# Patient Record
Sex: Female | Born: 1945 | Race: White | Hispanic: No | Marital: Single | Smoking: Former smoker
Health system: Southern US, Community
[De-identification: ages and names within clinical notes are randomized; demographics above are authoritative.]

## PROBLEM LIST (undated history)

## (undated) DIAGNOSIS — S63414A Traumatic rupture of collateral ligament of right ring finger at metacarpophalangeal and interphalangeal joint, initial encounter: Secondary | ICD-10-CM

## (undated) DIAGNOSIS — E860 Dehydration: Secondary | ICD-10-CM

## (undated) DIAGNOSIS — R748 Abnormal levels of other serum enzymes: Secondary | ICD-10-CM

## (undated) DIAGNOSIS — R519 Headache, unspecified: Secondary | ICD-10-CM

## (undated) DIAGNOSIS — E861 Hypovolemia: Secondary | ICD-10-CM

## (undated) DIAGNOSIS — H6993 Unspecified Eustachian tube disorder, bilateral: Secondary | ICD-10-CM

## (undated) DIAGNOSIS — U071 COVID-19: Secondary | ICD-10-CM

## (undated) DIAGNOSIS — H6690 Otitis media, unspecified, unspecified ear: Secondary | ICD-10-CM

## (undated) DIAGNOSIS — C50919 Malignant neoplasm of unspecified site of unspecified female breast: Secondary | ICD-10-CM

## (undated) DIAGNOSIS — M5412 Radiculopathy, cervical region: Secondary | ICD-10-CM

## (undated) DIAGNOSIS — M8580 Other specified disorders of bone density and structure, unspecified site: Secondary | ICD-10-CM

## (undated) DIAGNOSIS — R5383 Other fatigue: Secondary | ICD-10-CM

## (undated) DIAGNOSIS — U099 Post covid-19 condition, unspecified: Secondary | ICD-10-CM

## (undated) DIAGNOSIS — J0120 Acute ethmoidal sinusitis, unspecified: Secondary | ICD-10-CM

## (undated) DIAGNOSIS — L570 Actinic keratosis: Secondary | ICD-10-CM

## (undated) DIAGNOSIS — S161XXA Strain of muscle, fascia and tendon at neck level, initial encounter: Secondary | ICD-10-CM

## (undated) DIAGNOSIS — R051 Acute cough: Secondary | ICD-10-CM

## (undated) DIAGNOSIS — E559 Vitamin D deficiency, unspecified: Secondary | ICD-10-CM

## (undated) DIAGNOSIS — R55 Syncope and collapse: Secondary | ICD-10-CM

## (undated) DIAGNOSIS — R42 Dizziness and giddiness: Secondary | ICD-10-CM

## (undated) DIAGNOSIS — G909 Disorder of the autonomic nervous system, unspecified: Secondary | ICD-10-CM

## (undated) HISTORY — DX: Hypovolemia: E86.1

## (undated) HISTORY — DX: Headache, unspecified: R51.9

## (undated) HISTORY — DX: Radiculopathy, cervical region: M54.12

## (undated) HISTORY — DX: Otitis media, unspecified, unspecified ear: H66.90

## (undated) HISTORY — PX: BREAST LUMPECTOMY: SHX2

## (undated) HISTORY — DX: Strain of muscle, fascia and tendon at neck level, initial encounter: S16.1XXA

## (undated) HISTORY — DX: Actinic keratosis: L57.0

## (undated) HISTORY — PX: BREAST LUMPECTOMY WITH AXILLARY LYMPH NODE BIOPSY: SHX5593

## (undated) HISTORY — DX: COVID-19: U07.1

## (undated) HISTORY — DX: Abnormal levels of other serum enzymes: R74.8

## (undated) HISTORY — DX: Unspecified eustachian tube disorder, bilateral: H69.93

## (undated) HISTORY — DX: Disorder of the autonomic nervous system, unspecified: G90.9

## (undated) HISTORY — DX: Post covid-19 condition, unspecified: U09.9

## (undated) HISTORY — DX: Other fatigue: R53.83

## (undated) HISTORY — DX: Malignant neoplasm of unspecified site of unspecified female breast: C50.919

## (undated) HISTORY — DX: Other specified disorders of bone density and structure, unspecified site: M85.80

## (undated) HISTORY — DX: Acute ethmoidal sinusitis, unspecified: J01.20

## (undated) HISTORY — DX: Acute cough: R05.1

## (undated) HISTORY — DX: Syncope and collapse: R55

## (undated) HISTORY — DX: Dizziness and giddiness: R42

## (undated) HISTORY — DX: Dehydration: E86.0

## (undated) HISTORY — DX: Vitamin D deficiency, unspecified: E55.9

## (undated) HISTORY — DX: Traumatic rupture of collateral ligament of right ring finger at metacarpophalangeal and interphalangeal joint, initial encounter: S63.414A

## (undated) NOTE — *Deleted (*Deleted)
[facility]   [phone] [phone]   Clinic Follow up Note   Patient Care Team: Lewis Moccasin, MD as PCP - General (Family Medicine) Seward Meth, Dennison Nancy, MD as Referring Physician (Hematology and Oncology) Darryl Lent, MD as Referring Physician (Surgical Oncology) Wynetta Emery, MD as Referring Physician (Radiation Oncology) Loa Socks, NP as Nurse Practitioner (Hematology and Oncology)  Date of Service:  07/17/2020  CHIEF COMPLAINT: Follow up right breast cancer  SUMMARY OF ONCOLOGIC HISTORY: Oncology History Overview Note  Cancer Staging Breast cancer of upper-outer quadrant of right female breast [facility]) Staging form: Breast, AJCC 7th Edition - Pathologic stage from 02/13/2017: Stage IA (T1c(m), N0, cM0) - Signed by Malachy Mood, MD on 10/27/2016     Breast cancer of upper-outer quadrant of right female breast (HCC)  12/12/2015 Mammogram   Screening Mammogram 12/12/15 UOQ right breast density.   12/25/2015 Mammogram   Bilateral diagnostic mammogram and [place] 12/25/15 1) Irregular mass with partially obscured spiculated margins measuring 1.7 cm in the UOQ right breast. 2) USshowed a round hypoechoic mass with indistinct margins in the right breast the 10:00 position 3 cm the nipple which measured 1.1 x 0.9 x 1.1 cm. Additionally, there was a second adjacent mass in the right breast at the 10:00 position 3 cm the nipple which measured 0.5 x 0.6 cm. US of the right axilla was negative.   01/03/2016 Initial Biopsy   Right breast biopsy 01/03/16 Biopsy of the right smaller mass revealed LCIS with no invasive carcinoma identified. Biopsy of the larger right breast mass revealed grade 2 invasive lobular carcinoma.   01/03/2016 Receptors her2   ER 71-80% +, PR 91-100% +, HER2 -   01/23/2016 Breast MRI   Bilateral breast MRI 01/23/16 Adjacent enhancing masses in the right breast at 10:00, consistent with  biopsy-proven ILC and LCIS and  consistent with prior mammograms and ultrasound.The area including the two masses measured approximately 1.6 x 1.3 x 0.9 cm (although the extent of disease on ultrasound measured 3.0 cm).No evidence of additional sites of disease.   01/24/2016 Initial Diagnosis   Breast cancer of upper-outer quadrant of right female breast (HCC)   02/14/2016 Surgery   Right lumpectomy and SLN biopsy 02/14/16 Lumpectomy revealed grade 2 invasive lobular carcinoma with two foci, measuring 1.4 cm and 0.6 cm in a background of lobular carcinoma in situ, negative margins. A biopsied right axillary lymph node was negative. mpT1c, pN0   02/14/2016 Oncotype testing   Oncotype DX score was 16; indicting a recurrence risk of 10% in 5 years with Tamoxifen.   03/24/2016 Imaging   Bone density study 03/24/16  Osteopenia with a T-Score of -2.3 in the AP spine and -0.5 in the femoral neck.    2017 -  Radiation Therapy   The patient was offered radiation therapy, but declined due to her concerns of side effects.    09/11/2016 - 10/08/2016 Anti-estrogen oral therapy   Dr. Merlene Pulling prescribed Tamoxifen 20 mg once a day on 09/11/16. The patient stopped on 10/08/16 due to mood swings (depression), night sweats, difficulty sleeping, broken nails, headaches, right arm pain, light-headedness, chills, fever, fatigue, abdominal bloating, and abdominal pain. The patient reports her symptoms have since resolved, except for fatigue.    02/16/2018 Mammogram   02/16/2018 Mammogram FINDINGS: The breasts are heterogeneously dense, which may obscure small masses.  At the surgical site in the right breast, there is density and  architectural distortion, stable compared to the  prior films and  consistent with a post surgical scar. The lumpectomy site shows no  mammographic evidence of recurrent malignancy. There are no suspicious  masses, calcifications, or other findings in either breast.       CURRENT THERAPY:  Surveillance   INTERVAL  HISTORY: *** Regina Vincent is here for a follow up of right breast cancer. She was last seen by me 1 year ago. She presents to the clinic alone.    REVIEW OF SYSTEMS:  *** Constitutional: Denies fevers, chills or abnormal weight loss Eyes: Denies blurriness of vision Ears, nose, mouth, throat, and face: Denies mucositis or sore throat Respiratory: Denies cough, dyspnea or wheezes Cardiovascular: Denies palpitation, chest discomfort or lower extremity swelling Gastrointestinal:  Denies nausea, heartburn or change in bowel habits Skin: Denies abnormal skin rashes Lymphatics: Denies new lymphadenopathy or easy bruising Neurological:Denies numbness, tingling or new weaknesses Behavioral/Psych: Mood is stable, no new changes  All other systems were reviewed with the patient and are negative.  MEDICAL HISTORY:  Past Medical History:  Diagnosis Date  . Breast cancer [facility])     SURGICAL HISTORY: Past Surgical History:  Procedure Laterality Date  . BREAST LUMPECTOMY    . BREAST LUMPECTOMY WITH AXILLARY LYMPH NODE BIOPSY Right   . cataract surgery   2015    I have reviewed the social history and family history with the patient and they are unchanged from previous note.  ALLERGIES:  is allergic to naproxen, shellfish-derived products, and ibuprofen.  MEDICATIONS:  Current Outpatient Medications  Medication Sig Dispense Refill  . aspirin 81 MG chewable tablet Chew by mouth.    . TURMERIC CURCUMIN PO Take 400 mg by mouth 2 (two) times daily.     No current facility-administered medications for this visit.    PHYSICAL EXAMINATION: ECOG PERFORMANCE STATUS: {CHL ONC ECOG PS:[id number]}  There were no vitals filed for this visit. There were no vitals filed for this visit. *** GENERAL:alert, no distress and comfortable SKIN: skin color, texture, turgor are normal, no rashes or significant lesions EYES: normal, Conjunctiva are pink and non-injected, sclera clear {OROPHARYNX:no  exudate, no erythema and lips, buccal mucosa, and tongue normal}  NECK: supple, thyroid normal size, non-tender, without nodularity LYMPH:  no palpable lymphadenopathy in the cervical, axillary {or inguinal} LUNGS: clear to auscultation and percussion with normal breathing effort HEART: regular rate & rhythm and no murmurs and no lower extremity edema ABDOMEN:abdomen soft, non-tender and normal bowel sounds Musculoskeletal:no cyanosis of digits and no clubbing  NEURO: alert & oriented x 3 with fluent speech, no focal motor/sensory deficits  LABORATORY DATA:  I have reviewed the data as listed CBC Latest Ref Rng & Units 06/22/2019 06/13/2018 05/24/2017  WBC 4.0 - 10.5 K/uL 7.6 5.7 8.3  Hemoglobin 12.0 - 15.0 g/dL 16.1 09.6 04.5  Hematocrit 36 - 46 % 40.3 41.4 42.0  Platelets 150 - 400 K/uL 155 193 210     CMP Latest Ref Rng & Units 06/22/2019 06/13/2018 05/24/2017  Glucose 70 - 99 mg/dL 75 409(W) 119  BUN 8 - 23 mg/dL 21 16 14.7  Creatinine 0.44 - 1.00 mg/dL 8.29 5.62 0.9  Sodium 130 - 145 mmol/L 139 136 138  Potassium 3.5 - 5.1 mmol/L 3.8 4.1 4.6  Chloride 98 - 111 mmol/L 102 101 -  CO2 22 - 32 mmol/L 28 28 29   Calcium 8.9 - 10.3 mg/dL 9.5 9.3 86.5  Total Protein 6.5 - 8.1 g/dL 6.7 6.9 7.4  Total Bilirubin 0.3 - 1.2  mg/dL 0.9 1.0 8.29  Alkaline Phos 38 - 126 U/L 68 59 62  AST 15 - 41 U/L 30 33 54(H)  ALT 0 - 44 U/L 40 32 39      RADIOGRAPHIC STUDIES: I have personally reviewed the radiological images as listed and agreed with the findings in the report. No results found.   ASSESSMENT & PLAN:  Regina Vincent is a 35 y.o. female with    1. 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 She is s/p right lumpectomy and SLNB. The Oncotype DX score was 16. This is low risk disease, and does not need adjuvant chemo. The patient was offered  radiation therapy, but declined due to her concerns of side effects. She attempted Tamoxifen but stopped after 1 month due to poor tolerance.  -She is clinically doing well. Lab reviewed, her CBC and CMP are within normal limits. Her physical exam normal. Her 02/2019 mammogram at [facility] was unremarkable. There is no clinical concern for recurrence. -Continue surveillance. Next mammogram in 02/2020. I discussed the option of additional screening with breast MRI, she declined.  -F/u in 1 year. F/u with her PCP in interim.   2. Osteopenia -03/24/16 DEXA revealed osteopenia with a T-Score of -2.3 in the AP spine and -0.5 in the femoral neck. -She is on Vitamin D for her VitD deficiency. She previously declined oral calcium.  -I encouraged her to remain very active with walking, running and weight bearing exercise.    PLAN -Lab and f/u in 1 year -Mammogram in 02/2020   No problem-specific Assessment & Plan notes found for this encounter.   No orders of the defined types were placed in this encounter.  All questions were answered. The patient knows to call the clinic with any problems, questions or concerns. No barriers to learning was detected. The total time spent in the appointment was {CHL ONC TIME VISIT - FAOZH:[id number]}.     Delphina Cahill 07/17/2020   Rogelia Rohrer, am acting as scribe for Malachy Mood, MD.   {Add scribe attestation statement}

---

## 2013-08-17 HISTORY — PX: OTHER SURGICAL HISTORY: SHX169

## 2016-01-24 DIAGNOSIS — C50411 Malignant neoplasm of upper-outer quadrant of right female breast: Secondary | ICD-10-CM | POA: Insufficient documentation

## 2016-06-15 ENCOUNTER — Inpatient Hospital Stay: Payer: Self-pay | Admitting: Hematology and Oncology

## 2016-06-22 ENCOUNTER — Inpatient Hospital Stay: Payer: Self-pay | Admitting: Hematology and Oncology

## 2016-08-25 ENCOUNTER — Inpatient Hospital Stay: Payer: Federal, State, Local not specified - PPO

## 2016-08-25 ENCOUNTER — Encounter (INDEPENDENT_AMBULATORY_CARE_PROVIDER_SITE_OTHER): Payer: Self-pay

## 2016-08-25 ENCOUNTER — Inpatient Hospital Stay
Admission: RE | Admit: 2016-08-25 | Discharge: 2016-08-25 | Disposition: A | Discharge status: Home / Self Care | Payer: Self-pay | Source: Ambulatory Visit | Attending: Hematology and Oncology | Admitting: Hematology and Oncology

## 2016-08-25 ENCOUNTER — Inpatient Hospital Stay
Payer: Federal, State, Local not specified - PPO | Attending: Hematology and Oncology | Admitting: Hematology and Oncology

## 2016-08-25 ENCOUNTER — Other Ambulatory Visit: Payer: Self-pay | Admitting: Hematology and Oncology

## 2016-08-25 ENCOUNTER — Encounter: Payer: Self-pay | Admitting: *Deleted

## 2016-08-25 ENCOUNTER — Encounter: Payer: Self-pay | Admitting: Hematology and Oncology

## 2016-08-25 ENCOUNTER — Ambulatory Visit
Admission: RE | Admit: 2016-08-25 | Discharge: 2016-08-25 | Disposition: A | Discharge status: Home / Self Care | Payer: Self-pay | Source: Ambulatory Visit | Attending: Hematology and Oncology | Admitting: Hematology and Oncology

## 2016-08-25 VITALS — BP 132/83 | HR 63 | Temp 97.5°F | Ht 62.0 in | Wt 110.0 lb

## 2016-08-25 DIAGNOSIS — Z170 Estrogen receptor positive status [ER+]: Principal | ICD-10-CM

## 2016-08-25 DIAGNOSIS — Z9011 Acquired absence of right breast and nipple: Secondary | ICD-10-CM

## 2016-08-25 DIAGNOSIS — C50411 Malignant neoplasm of upper-outer quadrant of right female breast: Secondary | ICD-10-CM

## 2016-08-25 DIAGNOSIS — M8588 Other specified disorders of bone density and structure, other site: Secondary | ICD-10-CM

## 2016-08-25 DIAGNOSIS — Z7982 Long term (current) use of aspirin: Secondary | ICD-10-CM

## 2016-08-25 DIAGNOSIS — M25611 Stiffness of right shoulder, not elsewhere classified: Secondary | ICD-10-CM | POA: Diagnosis not present

## 2016-08-25 DIAGNOSIS — M8580 Other specified disorders of bone density and structure, unspecified site: Secondary | ICD-10-CM | POA: Insufficient documentation

## 2016-08-25 DIAGNOSIS — Z87891 Personal history of nicotine dependence: Secondary | ICD-10-CM | POA: Diagnosis not present

## 2016-08-25 LAB — CBC WITH DIFFERENTIAL/PLATELET
Basophils Absolute: 0.1 10*3/uL (ref 0–0.1)
Basophils Relative: 1 %
Eosinophils Absolute: 0 10*3/uL (ref 0–0.7)
Eosinophils Relative: 0 %
HCT: 42.7 % (ref 35.0–47.0)
Hemoglobin: 14.7 g/dL (ref 12.0–16.0)
Lymphocytes Relative: 16 %
Lymphs Abs: 1.3 10*3/uL (ref 1.0–3.6)
MCH: 33.2 pg (ref 26.0–34.0)
MCHC: 34.5 g/dL (ref 32.0–36.0)
MCV: 96.1 fL (ref 80.0–100.0)
Monocytes Absolute: 0.4 10*3/uL (ref 0.2–0.9)
Monocytes Relative: 5 %
Neutro Abs: 6 10*3/uL (ref 1.4–6.5)
Neutrophils Relative %: 78 %
Platelets: 183 10*3/uL (ref 150–440)
RBC: 4.44 MIL/uL (ref 3.80–5.20)
RDW: 12.7 % (ref 11.5–14.5)
WBC: 7.7 10*3/uL (ref 3.6–11.0)

## 2016-08-25 LAB — COMPREHENSIVE METABOLIC PANEL
ALT: 21 U/L (ref 14–54)
AST: 31 U/L (ref 15–41)
Albumin: 4.5 g/dL (ref 3.5–5.0)
Alkaline Phosphatase: 60 U/L (ref 38–126)
Anion gap: 8 (ref 5–15)
BUN: 19 mg/dL (ref 6–20)
CO2: 28 mmol/L (ref 22–32)
Calcium: 9.9 mg/dL (ref 8.9–10.3)
Chloride: 98 mmol/L — ABNORMAL LOW (ref 101–111)
Creatinine, Ser: 0.7 mg/dL (ref 0.44–1.00)
GFR calc Af Amer: >60 mL/min (ref >60)
GFR calc non Af Amer: >60 mL/min (ref >60)
Glucose, Bld: 146 mg/dL — ABNORMAL HIGH (ref 65–99)
Potassium: 4.1 mmol/L (ref 3.5–5.1)
Sodium: 134 mmol/L — ABNORMAL LOW (ref 135–145)
Total Bilirubin: 0.7 mg/dL (ref 0.3–1.2)
Total Protein: 7.3 g/dL (ref 6.5–8.1)

## 2016-08-25 NOTE — Progress Notes (Signed)
  Oncology Nurse Navigator Documentation  Navigator Location: [facility]-Med Onc (08/25/16 1000)   )Navigator Encounter Type: Initial MedOnc (08/25/16 1000)    Met patient today during her initial visit with Dr. Mike Gip.  Patient was diagnosed in June 2017, and had her surgery at [facility].  She had refused radiation therapy and has not initiated any antihormonal therapy as of today.  Gave patient resource information, and contact info.  She is interested in support group and other resources that may be offered in [place].  Gave patient Arrie Aran Stuart's name to contact for more info about support group in [place].  She is to call with any questions or needs.  Will give Dawn a heads up that the patient may contact her.      Interventions: Referrals;Psycho-social support (08/25/16 1000) Referrals:  (support group) (08/25/16 1000)      Support Groups/Services: Breast Support Group (08/25/16 1000)             Time Spent with Patient: 30 (08/25/16 1000)

## 2016-08-25 NOTE — Progress Notes (Signed)
[place] [facility] day:  08/25/2016  Chief Complaint: Regina Vincent is a 71 y.o. female with stage IA multi-focal right breast cancer who is referred for new patient assessment by Dr. Lucile Crater.  HPI:  The patient states that "something was different" in her right breast in 05/2014.  Mammogram was negative.  Bilateral screening mammogram on 12/12/2015 revealed a right breast density in the upper outer quadrant of the right breast. Right breast ultrasound on 12/25/2015 revealed a 1.1 x 0.9 x 1.1 cm mass at the 10:00 position with an adjacent 0.5 x 0.6 cm mass, both 3 cm from the nipple. There were no abnormal nodes.  Breast MRI on 01/24/2016 revealed 2 adjacent masses in the right breast at the 10:00 position total area measured 1.6 x 1.3 x 0.9 cm.  She underwent biopsy of the two 10 o'clock lesions on 01/03/2016.  The smaller mass revealed lobular carcinoma in situ.  The larger mass revealed invasive lobular carcinoma.  Tumor was ER + (71-80%), PR + (91-100%), and Her2.neu -.  She underwent partial mastectomy with sentinel lymph node biopsy on 02/14/2016 by Dr. Glade Stanford.  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  Margins were negative (0.5 cm). One sentinel lymph node was negative. Pathologic stage was T1cN0.   He was seen by Dr. Bonner Puna, radiation oncologist at [facility], on 03/11/2016.  In general, recommendation would includea course of hypo-fractionated whole breast radiation. Discussions included the CALGB and PRIME trials that looked at the omission of radiation in women over the age of 23-70, the vast 66 of whom were ER + and had endocrine therapy. Omission of radiation was associated with a higher risk of local recurrence but no difference in survival. Recurrence rates were low overall.  At that point, the patient wished to follow-up with her Oncotype DX results.  If she declined endocrine therapy, he would  not recommend omission of radiation therapy.  She opted to forgo radiation.  Oncotype DX score was 16 which translates to a distant risk of recurrence of her breast cancer at 10% with 5 years of tamoxifen and 7% with aromatase inhibitors. Without chemotherapy, recurrence is estimated at 30%.   Bone density study on 03/24/2016 revealed osteopenia with a T score of -2.3 in the AP spine and -0.5 in the femoral neck.  She states that decrease of bone density in the lumbar spine is typical of elite runners.  She was seen by Dr. Lucile Crater, medical oncologist at [facility], on 04/08/2016.  Recommendation was for an aromatase inhibitor.  She did not wish to proceed with an aromatase inhibitor or tamoxifen. She read the side effects the NCI website website and worried about cardiac disease and diabetes. She was concerned about muscle and joint symptoms on an aromatase inhibitor as she is a long distance runner and wants to run.  Recommendation was to try an aromatase inhibitor and see how well she tolerates it.   As she was moving to [facility], she was referred to our clinic.  Symptomatically, she denies any hot flashes or night sweats.  She has had right arm stiffness and decreased mobility of her right arm since surgery.   Past Medical History:  Diagnosis Date  . Breast cancer [facility])     Past Surgical History:  Procedure Laterality Date  . BREAST LUMPECTOMY WITH AXILLARY LYMPH NODE BIOPSY Right     Family History  Problem Relation Age of Onset  .  Diabetes Mother   . Hypertension Mother   . Diabetes Father   . Hypertension Father   . Cancer Brother 66    prostate    Social History:  reports that she quit smoking about 44 years ago. She has never used smokeless tobacco. She reports that she drinks about 0.6 oz of alcohol per week . She reports that she does not use drugs.  She is a Social worker.  She runs 50-60 miles/week.  Running makes her "happy, alive".  She does not want to stop  running.  She previously lived in [place] and now lives in [place]. The patient is alone today.  Allergies:  Allergies  Allergen Reactions  . Naproxen Hives  . Shellfish-Derived Products Diarrhea    Scallops particularly  . Ibuprofen Itching    Current Medications: Current Outpatient Prescriptions  Medication Sig Dispense Refill  . aspirin 81 MG chewable tablet Chew by mouth.    . fluorometholone (FML) 0.1 % ophthalmic suspension     . Multiple Vitamin (MULTIVITAMIN) tablet Take 1 tablet by mouth daily.     No current facility-administered medications for this visit.     Review of Systems:  GENERAL:  Feels good.  Active.  No fevers, sweats or weight loss. PERFORMANCE STATUS (ECOG):  0 HEENT:  No visual changes, runny nose, sore throat, mouth sores or tenderness. Lungs: No shortness of breath or cough.  No hemoptysis. Cardiac:  No chest pain, palpitations, orthopnea, or PND. GI:  No nausea, vomiting, diarrhea, constipation, melena or hematochezia. GU:  No urgency, frequency, dysuria, or hematuria. Musculoskeletal:  No back pain.  No joint pain.  No muscle tenderness. Extremities:  Right arm stiffness with decreased mobility after surgery.  No pain or swelling. Skin:  No rashes or skin changes. Neuro:  No headache, numbness or weakness, balance or coordination issues. Endocrine:  No diabetes, thyroid issues, hot flashes or night sweats. Psych:  No mood changes, depression or anxiety. Pain:  No focal pain. Review of systems:  All other systems reviewed and found to be negative.  Physical Exam: Blood pressure 132/83, pulse 63, temperature 97.5 F (36.4 C), temperature source Oral, height 5' 2" (1.575 m), weight 110 lb (49.9 kg). GENERAL:  Thin woman sitting comfortably in the exam room in no acute distress. MENTAL STATUS:  Alert and oriented to person, place and time. HEAD:  White jaw length hair.  Normocephalic, atraumatic, face symmetric, no Cushingoid features. EYES:   Blue eyes.  Pupils equal round and reactive to light and accomodation.  No conjunctivitis or scleral icterus. ENT:  Oropharynx clear without lesion.  Tongue normal. Mucous membranes moist.  RESPIRATORY:  Clear to auscultation without rales, wheezes or rhonchi. CARDIOVASCULAR:  Regular rate and rhythm without murmur, rub or gallop. BREAST:  Right breast s/p lumpectomy.  No masses, skin changes or nipple discharge.  Left breast without masses, skin changes or nipple discharge.  ABDOMEN:  Soft, non-tender, with active bowel sounds, and no hepatosplenomegaly.  No masses. SKIN:  No rashes, ulcers or lesions. EXTREMITIES: No edema, no skin discoloration or tenderness.  No palpable cords. LYMPH NODES: No palpable cervical, supraclavicular, axillary or inguinal adenopathy  NEUROLOGICAL: Unremarkable. PSYCH:  Appropriate.   Appointment on 08/25/2016  Component Date Value Ref Range Status  . WBC 08/25/2016 7.7  3.6 - 11.0 K/uL Final  . RBC 08/25/2016 4.44  3.80 - 5.20 MIL/uL Final  . Hemoglobin 08/25/2016 14.7  12.0 - 16.0 g/dL Final  . HCT 08/25/2016 42.7  35.0 - 47.0 % Final  . MCV 08/25/2016 96.1  80.0 - 100.0 fL Final  . MCH 08/25/2016 33.2  26.0 - 34.0 pg Final  . MCHC 08/25/2016 34.5  32.0 - 36.0 g/dL Final  . RDW 08/25/2016 12.7  11.5 - 14.5 % Final  . Platelets 08/25/2016 183  150 - 440 K/uL Final  . Neutrophils Relative % 08/25/2016 78  % Final  . Neutro Abs 08/25/2016 6.0  1.4 - 6.5 K/uL Final  . Lymphocytes Relative 08/25/2016 16  % Final  . Lymphs Abs 08/25/2016 1.3  1.0 - 3.6 K/uL Final  . Monocytes Relative 08/25/2016 5  % Final  . Monocytes Absolute 08/25/2016 0.4  0.2 - 0.9 K/uL Final  . Eosinophils Relative 08/25/2016 0  % Final  . Eosinophils Absolute 08/25/2016 0.0  0 - 0.7 K/uL Final  . Basophils Relative 08/25/2016 1  % Final  . Basophils Absolute 08/25/2016 0.1  0 - 0.1 K/uL Final  . Sodium 08/25/2016 134* 135 - 145 mmol/L Final  . Potassium 08/25/2016 4.1  3.5 - 5.1  mmol/L Final  . Chloride 08/25/2016 98* 101 - 111 mmol/L Final  . CO2 08/25/2016 28  22 - 32 mmol/L Final  . Glucose, Bld 08/25/2016 146* 65 - 99 mg/dL Final  . BUN 08/25/2016 19  6 - 20 mg/dL Final  . Creatinine, Ser 08/25/2016 0.70  0.44 - 1.00 mg/dL Final  . Calcium 08/25/2016 9.9  8.9 - 10.3 mg/dL Final  . Total Protein 08/25/2016 7.3  6.5 - 8.1 g/dL Final  . Albumin 08/25/2016 4.5  3.5 - 5.0 g/dL Final  . AST 08/25/2016 31  15 - 41 U/L Final  . ALT 08/25/2016 21  14 - 54 U/L Final  . Alkaline Phosphatase 08/25/2016 60  38 - 126 U/L Final  . Total Bilirubin 08/25/2016 0.7  0.3 - 1.2 mg/dL Final  . GFR calc non Af Amer 08/25/2016 >60  >60 mL/min Final  . GFR calc Af Amer 08/25/2016 >60  >60 mL/min Final   Comment: (NOTE) The eGFR has been calculated using the CKD EPI equation. This calculation has not been validated in all clinical situations. eGFR's persistently <60 mL/min signify possible Chronic Kidney Disease.   . Anion gap 08/25/2016 8  5 - 15 Final  . CA 27.29 08/26/2016 34.2  0.0 - 38.6 U/mL Final   Comment: (NOTE) [employer] [facility]/ACS methodology Performed At: [facility] [address] , [place] [id number] [place] William F MD Ph:[phone]     Assessment:  Regina Vincent is a 70 y.o. female with stage IA multi-focal right breast cancer s/p partial mastectomy with sentinel lymph node biopsy on 02/14/2016.  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  Margins were negative (0.5 cm). One sentinel lymph node was negative. Tumor was ER + (71-80%), PR + (91-100%), and Her2.neu -.  Pathologic stage was T1cN0.   Oncotype DX score was 16 which translates to a distant risk of recurrence of her breast cancer at 10% with 5 years of tamoxifen and 7% with aromatase inhibitors. Without chemotherapy, recurrence is estimated at 30%.   She opted to forgo radiation.  She has declined tamoxifen and aromatase inhibitors.  Bone  density study on 03/24/2016 revealed osteopenia with a T score of -2.3 in the AP spine and -0.5 in the femoral neck.  She states that decrease of bone density in the lumbar spine is typical of elite runners.  Symptomatically, she has had   right arm stiffness and decreased mobility of her right arm since surgery.  Plan: 1.  Discuss diagnosis, staging and management of lobular breast cancer.  Discuss radiation oncology consult.  Radiation was recommended if patient did not receive endocrine therapy.  Discuss Oncotype DX results and risk of recurrence with tamoxifen, aromatase inhibitors, and no therapy.  Discuss side effects of tamoxifen and aromatase inhibitors.  Patient voiced concern for potential side effects.  She stated that she wanted to be happy and live well then live for a long time with side effects.  I discussed a trial of hormonal therapy to see how she tolerates it.   2.  Discuss osteopenia and treatment with calcium, vitamin D, and a bisphosphonate or a bone modifying agent such as Prolia.  Side effects were reviewed including potential osteonecrosis of the jaw.  Discuss dental evaluation. 3.  Discuss decreased mobility in right arm since surgery.  Patient had some initial improvement with physical therapy.  Discuss reassess by PT to help with mobility exercises. 4.  Labs today:  CBC with diff, CMP, CA27.29. 5.  Schedule bilateral mammogram 12/11/2016. 6.  Preauth Prolia. 7.  Physical therapy consult. 8.  RTC in 1 month for MD assessment and labs (CMP) +/- Prolia.   Lequita Asal, MD  08/25/2016, 10:16 AM

## 2016-08-25 NOTE — Progress Notes (Signed)
Patient here for initial visit. No clinical problems today. She had a lumpectomy in June 2017 and has some residual muscle pain in her right arm that is position dependent.

## 2016-08-25 NOTE — Patient Instructions (Signed)
Alendronate tablets What is this medicine? ALENDRONATE (a LEN droe nate) slows calcium loss from bones. It helps to make normal healthy bone and to slow bone loss in people with Paget's disease and osteoporosis. It may be used in others at risk for bone loss. This medicine may be used for other purposes; ask your health care provider or pharmacist if you have questions. COMMON BRAND NAME(S): Fosamax What should I tell my health care provider before I take this medicine? They need to know if you have any of these conditions: -dental disease -esophagus, stomach, or intestine problems, like acid reflux or GERD -kidney disease -low blood calcium -low vitamin D -problems sitting or standing 30 minutes -trouble swallowing -an unusual or allergic reaction to alendronate, other medicines, foods, dyes, or preservatives -pregnant or trying to get pregnant -breast-feeding How should I use this medicine? You must take this medicine exactly as directed or you will lower the amount of the medicine you absorb into your body or you may cause yourself harm. Take this medicine by mouth first thing in the morning, after you are up for the day. Do not eat or drink anything before you take your medicine. Swallow the tablet with a full glass (6 to 8 fluid ounces) of plain water. Do not take this medicine with any other drink. Do not chew or crush the tablet. After taking this medicine, do not eat breakfast, drink, or take any medicines or vitamins for at least 30 minutes. Sit or stand up for at least 30 minutes after you take this medicine; do not lie down. Do not take your medicine more often than directed. Talk to your pediatrician regarding the use of this medicine in children. Special care may be needed. Overdosage: If you think you have taken too much of this medicine contact a poison control center or emergency room at once. NOTE: This medicine is only for you. Do not share this medicine with others. What if I  miss a dose? If you miss a dose, do not take it later in the day. Continue your normal schedule starting the next morning. Do not take double or extra doses. What may interact with this medicine? -aluminum hydroxide -antacids -aspirin -calcium supplements -drugs for inflammation like ibuprofen, naproxen, and others -iron supplements -magnesium supplements -vitamins with minerals This list may not describe all possible interactions. Give your health care provider a list of all the medicines, herbs, non-prescription drugs, or dietary supplements you use. Also tell them if you smoke, drink alcohol, or use illegal drugs. Some items may interact with your medicine. What should I watch for while using this medicine? Visit your doctor or health care professional for regular checks ups. It may be some time before you see benefit from this medicine. Do not stop taking your medicine except on your doctor's advice. Your doctor or health care professional may order blood tests and other tests to see how you are doing. You should make sure you get enough calcium and vitamin D while you are taking this medicine, unless your doctor tells you not to. Discuss the foods you eat and the vitamins you take with your health care professional. Some people who take this medicine have severe bone, joint, and/or muscle pain. This medicine may also increase your risk for a broken thigh bone. Tell your doctor right away if you have pain in your upper leg or groin. Tell your doctor if you have any pain that does not go away or that gets worse.  This medicine can make you more sensitive to the sun. If you get a rash while taking this medicine, sunlight may cause the rash to get worse. Keep out of the sun. If you cannot avoid being in the sun, wear protective clothing and use sunscreen. Do not use sun lamps or tanning beds/booths. What side effects may I notice from receiving this medicine? Side effects that you should report to  your doctor or health care professional as soon as possible: -allergic reactions like skin rash, itching or hives, swelling of the face, lips, or tongue -black or tarry stools -bone, muscle or joint pain -changes in vision -chest pain -heartburn or stomach pain -jaw pain, especially after dental work -pain or trouble when swallowing -redness, blistering, peeling or loosening of the skin, including inside the mouth Side effects that usually do not require medical attention (report to your doctor or health care professional if they continue or are bothersome): -changes in taste -diarrhea or constipation -eye pain or itching -headache -nausea or vomiting -stomach gas or fullness This list may not describe all possible side effects. Call your doctor for medical advice about side effects. You may report side effects to FDA at 1-800-FDA-1088. Where should I keep my medicine? Keep out of the reach of children. Store at room temperature of 15 and 30 degrees C (59 and 86 degrees F). Throw away any unused medicine after the expiration date. NOTE: This sheet is a summary. It may not cover all possible information. If you have questions about this medicine, talk to your doctor, pharmacist, or health care provider.  2017 Elsevier/Gold Standard (2011-01-30 08:56:09) Denosumab injection What is this medicine? DENOSUMAB (den oh sue mab) slows bone breakdown. Prolia is used to treat osteoporosis in women after menopause and in men. Delton See is used to prevent bone fractures and other bone problems caused by cancer bone metastases. Delton See is also used to treat giant cell tumor of the bone. COMMON BRAND NAME(S): Prolia, XGEVA What should I tell my health care provider before I take this medicine? They need to know if you have any of these conditions: -dental disease -eczema -infection or history of infections -kidney disease or on dialysis -low blood calcium or vitamin D -malabsorption  syndrome -scheduled to have surgery or tooth extraction -taking medicine that contains denosumab -thyroid or parathyroid disease -an unusual reaction to denosumab, other medicines, foods, dyes, or preservatives -pregnant or trying to get pregnant -breast-feeding How should I use this medicine? This medicine is for injection under the skin. It is given by a health care professional in a hospital or clinic setting. If you are getting Prolia, a special MedGuide will be given to you by the pharmacist with each prescription and refill. Be sure to read this information carefully each time. For Prolia, talk to your pediatrician regarding the use of this medicine in children. Special care may be needed. For Delton See, talk to your pediatrician regarding the use of this medicine in children. While this drug may be prescribed for children as young as 13 years for selected conditions, precautions do apply. What if I miss a dose? It is important not to miss your dose. Call your doctor or health care professional if you are unable to keep an appointment. What may interact with this medicine? Do not take this medicine with any of the following medications: -other medicines containing denosumab This medicine may also interact with the following medications: -medicines that suppress the immune system -medicines that treat cancer -steroid medicines like  prednisone or cortisone What should I watch for while using this medicine? Visit your doctor or health care professional for regular checks on your progress. Your doctor or health care professional may order blood tests and other tests to see how you are doing. Call your doctor or health care professional if you get a cold or other infection while receiving this medicine. Do not treat yourself. This medicine may decrease your body's ability to fight infection. You should make sure you get enough calcium and vitamin D while you are taking this medicine, unless your  doctor tells you not to. Discuss the foods you eat and the vitamins you take with your health care professional. See your dentist regularly. Brush and floss your teeth as directed. Before you have any dental work done, tell your dentist you are receiving this medicine. Do not become pregnant while taking this medicine or for 5 months after stopping it. Women should inform their doctor if they wish to become pregnant or think they might be pregnant. There is a potential for serious side effects to an unborn child. Talk to your health care professional or pharmacist for more information. What side effects may I notice from receiving this medicine? Side effects that you should report to your doctor or health care professional as soon as possible: -allergic reactions like skin rash, itching or hives, swelling of the face, lips, or tongue -breathing problems -chest pain -fast, irregular heartbeat -feeling faint or lightheaded, falls -fever, chills, or any other sign of infection -muscle spasms, tightening, or twitches -numbness or tingling -skin blisters or bumps, or is dry, peels, or red -slow healing or unexplained pain in the mouth or jaw -unusual bleeding or bruising Side effects that usually do not require medical attention (report to your doctor or health care professional if they continue or are bothersome): -muscle pain -stomach upset, gas Where should I keep my medicine? This medicine is only given in a clinic, doctor's office, or other health care setting and will not be stored at home.  2017 Elsevier/Gold Standard (2015-09-05 10:06:55) Tamoxifen oral tablet What is this medicine? TAMOXIFEN (ta MOX i fen) blocks the effects of estrogen. It is commonly used to treat breast cancer. It is also used to decrease the chance of breast cancer coming back in women who have received treatment for the disease. It may also help prevent breast cancer in women who have a high risk of developing  breast cancer. This medicine may be used for other purposes; ask your health care provider or pharmacist if you have questions. COMMON BRAND NAME(S): Nolvadex What should I tell my health care provider before I take this medicine? They need to know if you have any of these conditions: -blood clots -blood disease -cataracts or impaired eyesight -endometriosis -high calcium levels -high cholesterol -irregular menstrual cycles -liver disease -stroke -uterine fibroids -an unusual reaction to tamoxifen, other medicines, foods, dyes, or preservatives -pregnant or trying to get pregnant -breast-feeding How should I use this medicine? Take this medicine by mouth with a glass of water. Follow the directions on the prescription label. You can take it with or without food. Take your medicine at regular intervals. Do not take your medicine more often than directed. Do not stop taking except on your doctor's advice. A special MedGuide will be given to you by the pharmacist with each prescription and refill. Be sure to read this information carefully each time. Talk to your pediatrician regarding the use of this medicine in children. While  this drug may be prescribed for selected conditions, precautions do apply. Overdosage: If you think you have taken too much of this medicine contact a poison control center or emergency room at once. NOTE: This medicine is only for you. Do not share this medicine with others. What if I miss a dose? If you miss a dose, take it as soon as you can. If it is almost time for your next dose, take only that dose. Do not take double or extra doses. What may interact with this medicine? Do not take this medicine with any of the following medications: -cisapride -certain medicines for irregular heart beat like dofetilide, dronedarone, quinidine -certain medicines for fungal infection like fluconazole, posaconazole -pimozide -saquinavir -thioridazine This medicine may  also interact with the following medications: -aminoglutethimide -anastrozole -bromocriptine -chemotherapy drugs -female hormones, like estrogens and birth control pills -letrozole -medroxyprogesterone -phenobarbital -rifampin -warfarin This list may not describe all possible interactions. Give your health care provider a list of all the medicines, herbs, non-prescription drugs, or dietary supplements you use. Also tell them if you smoke, drink alcohol, or use illegal drugs. Some items may interact with your medicine. What should I watch for while using this medicine? Visit your doctor or health care professional for regular checks on your progress. You will need regular pelvic exams, breast exams, and mammograms. If you are taking this medicine to reduce your risk of getting breast cancer, you should know that this medicine does not prevent all types of breast cancer. If breast cancer or other problems occur, there is no guarantee that it will be found at an early stage. Do not become pregnant while taking this medicine or for 2 months after stopping this medicine. Stop taking this medicine if you get pregnant or think you are pregnant and contact your doctor. This medicine may harm your unborn baby. Women who can possibly become pregnant should use birth control methods that do not use hormones during tamoxifen treatment and for 2 months after therapy has stopped. Talk with your health care provider for birth control advice. Do not breast feed while taking this medicine. What side effects may I notice from receiving this medicine? Side effects that you should report to your doctor or health care professional as soon as possible: -allergic reactions like skin rash, itching or hives, swelling of the face, lips, or tongue -changes in vision -changes in your menstrual cycle -difficulty walking or talking -new breast lumps -numbness -pelvic pain or pressure -redness, blistering, peeling or  loosening of the skin, including inside the mouth -signs and symptoms of a dangerous change in heartbeat or heart rhythm like chest pain, dizziness, fast or irregular heartbeat, palpitations, feeling faint or lightheaded, falls, breathing problems -sudden chest pain -swelling, pain or tenderness in your calf or leg -unusual bruising or bleeding -vaginal discharge that is bloody, brown, or rust -weakness -yellowing of the whites of the eyes or skin Side effects that usually do not require medical attention (report to your doctor or health care professional if they continue or are bothersome): -fatigue -hair loss, although uncommon and is usually mild -headache -hot flashes -impotence (in men) -nausea, vomiting (mild) -vaginal discharge (white or clear) This list may not describe all possible side effects. Call your doctor for medical advice about side effects. You may report side effects to FDA at 1-800-FDA-1088. Where should I keep my medicine? Keep out of the reach of children. Store at room temperature between 20 and 25 degrees C (68 and 77 degrees  F). Protect from light. Keep container tightly closed. Throw away any unused medicine after the expiration date. NOTE: This sheet is a summary. It may not cover all possible information. If you have questions about this medicine, talk to your doctor, pharmacist, or health care provider.  2017 Elsevier/Gold Standard (2016-02-21 07:27:41)

## 2016-08-26 LAB — CANCER ANTIGEN 27.29: CA 27.29: 34.2 U/mL (ref 0.0–38.6)

## 2016-09-01 ENCOUNTER — Telehealth: Payer: Self-pay | Admitting: *Deleted

## 2016-09-03 ENCOUNTER — Encounter: Payer: Self-pay | Admitting: Hematology and Oncology

## 2016-09-04 NOTE — Telephone Encounter (Signed)
Called patient to let her know that her PT referral had been made to [facility] and her appt was for 09-08-16 at 1045 am.  Left message for her to call [facility] if that appt did not work for her.

## 2016-09-06 ENCOUNTER — Encounter: Payer: Self-pay | Admitting: Hematology and Oncology

## 2016-09-07 ENCOUNTER — Telehealth: Payer: Self-pay | Admitting: *Deleted

## 2016-09-07 NOTE — Telephone Encounter (Signed)
Left message for patient if she wants to take the tamoxifen to call us back and we could arrange the prescription per Dr. Mike Gip

## 2016-09-08 ENCOUNTER — Telehealth: Payer: Self-pay | Admitting: *Deleted

## 2016-09-08 ENCOUNTER — Other Ambulatory Visit: Payer: Self-pay | Admitting: *Deleted

## 2016-09-08 ENCOUNTER — Other Ambulatory Visit: Payer: Self-pay | Admitting: Hematology and Oncology

## 2016-09-08 DIAGNOSIS — C50411 Malignant neoplasm of upper-outer quadrant of right female breast: Secondary | ICD-10-CM

## 2016-09-08 DIAGNOSIS — Z170 Estrogen receptor positive status [ER+]: Principal | ICD-10-CM

## 2016-09-08 MED ORDER — TAMOXIFEN CITRATE 20 MG PO TABS: 20.0000 mg | ORAL_TABLET | Freq: Every day | ORAL | 5 refills | Status: DC

## 2016-09-08 NOTE — Telephone Encounter (Signed)
Pt called to state that she filled out the new pt paperwork that had pharmacy written down and she does not understand why she has to provide it when it was scanned into her chart.  She uses [facility] on [address] and [address].  She is unavailable to take phone calls because she is on and off planes,. Send in her rx. I spoke to [place] and I sent the rx into her pharmacy after I entered it.  Dr. Mike Gip had already ordered it but it did not go anywhere because there was no pharmacy in at the time of the rx made.  I sent it electronically to pt's pharmacy.Olivia Mackie to call pt and let her know it was sent in

## 2016-09-21 NOTE — Progress Notes (Signed)
  Oncology Nurse Navigator Documentation  Navigator Location: [facility] (09/21/16 1400)   )Navigator Encounter Type: Telephone;Other (referral) (09/21/16 1400) Telephone: Incoming Call;Appt Confirmation/Clarification (09/21/16 1400)                       Barriers/Navigation Needs: Coordination of Care (09/21/16 1400)   Interventions: Coordination of Care;Referrals (09/21/16 1400)            Acuity: Level 2 (09/21/16 1400)   Acuity Level 2: Ongoing guidance and education throughout treatment as needed;Referrals such as genetics, survivorship (09/21/16 1400)  Patient phoned requesting assistance retrieving imaging disc.  Message sent to [facility] in Radiology.  Patient also requests transfer of care to Dr. Burr Medico in [place], since she now lives in [place].  Spoke to Dr. Mike Gip, and in basket message sent to Bary Castilla, and Iris Pert requesting assistance with this transfer of care.

## 2016-09-22 ENCOUNTER — Encounter: Payer: Self-pay | Admitting: Hematology

## 2016-09-22 ENCOUNTER — Telehealth: Payer: Self-pay | Admitting: Hematology

## 2016-09-22 NOTE — Telephone Encounter (Signed)
Pt returned my call to reschedule 2/20 appt w/Dr. Burr Medico. She states that she wil be going out of town and wont be back until the first weekend in March. Per patient ok to schedule on 3/13 at 230pm. She agreed to the appt date and time.

## 2016-09-23 NOTE — Progress Notes (Signed)
Phoned patient with appointment information regardind upcoming appointment with Dr. Burr Medico on 10/06/16 at 10:30.  Waiting to hear back about outside film retrieval.

## 2016-09-24 ENCOUNTER — Other Ambulatory Visit: Payer: Self-pay | Admitting: Hematology and Oncology

## 2016-09-24 DIAGNOSIS — M8588 Other specified disorders of bone density and structure, other site: Secondary | ICD-10-CM

## 2016-09-25 ENCOUNTER — Inpatient Hospital Stay: Payer: Federal, State, Local not specified - PPO | Attending: Hematology and Oncology

## 2016-09-25 ENCOUNTER — Inpatient Hospital Stay: Payer: Federal, State, Local not specified - PPO

## 2016-09-25 ENCOUNTER — Inpatient Hospital Stay: Payer: Federal, State, Local not specified - PPO | Admitting: Hematology and Oncology

## 2016-09-25 NOTE — Progress Notes (Deleted)
[facility]-  Cancer Center  Clinic day:  09/25/2016   Chief Complaint: Regina Vincent is a 71 y.o. female with stage IA multi-focal right breast cancer who is referred for 1 month assessment on tamoxifen and initiation of Prolia.  HPI:  The patient was last seen in the medical oncology clinic on 08/25/2016.  At that time, she was seen for new patient assessment.  She had stage IA multi-focal right breast cancer s/p partial mastectomy.  Tumor was ER +.  She had declined radiation and hormonal therapy.  Labs had included a normal CBC with diff, CMP, CA27.29 (34.2).  I encouraged her to consider hormonal therapy (tamoxifen versus aromatase inhibitors).  We discussed bisphosphonates or Prolia for her osteopenia.     Past Medical History:  Diagnosis Date  . Breast cancer (HCC)     Past Surgical History:  Procedure Laterality Date  . BREAST LUMPECTOMY WITH AXILLARY LYMPH NODE BIOPSY Right     Family History  Problem Relation Age of Onset  . Diabetes Mother   . Hypertension Mother   . Diabetes Father   . Hypertension Father   . Cancer Brother 70    prostate    Social History:  reports that she quit smoking about 44 years ago. She has never used smokeless tobacco. She reports that she drinks about 0.6 oz of alcohol per week . She reports that she does not use drugs.  She is a national champion runner.  She runs 50-60 miles/week.  Running makes her "happy, alive".  She does not want to stop running.  She previously lived in [place] and now lives in [place]. The patient is alone today.  Allergies:  Allergies  Allergen Reactions  . Naproxen Hives  . Shellfish-Derived Products Diarrhea    Scallops particularly  . Ibuprofen Itching    Current Medications: Current Outpatient Prescriptions  Medication Sig Dispense Refill  . aspirin 81 MG chewable tablet Chew by mouth.    . fluorometholone (FML) 0.1 % ophthalmic suspension     . Multiple Vitamin (MULTIVITAMIN)  tablet Take 1 tablet by mouth daily.    . tamoxifen (NOLVADEX) 20 MG tablet Take 1 tablet (20 mg total) by mouth daily. 30 tablet 5   No current facility-administered medications for this visit.     Review of Systems:  GENERAL:  Feels good.  Active.  No fevers, sweats or weight loss. PERFORMANCE STATUS (ECOG):  0 HEENT:  No visual changes, runny nose, sore throat, mouth sores or tenderness. Lungs: No shortness of breath or cough.  No hemoptysis. Cardiac:  No chest pain, palpitations, orthopnea, or PND. GI:  No nausea, vomiting, diarrhea, constipation, melena or hematochezia. GU:  No urgency, frequency, dysuria, or hematuria. Musculoskeletal:  No back pain.  No joint pain.  No muscle tenderness. Extremities:  Right arm stiffness with decreased mobility after surgery.  No pain or swelling. Skin:  No rashes or skin changes. Neuro:  No headache, numbness or weakness, balance or coordination issues. Endocrine:  No diabetes, thyroid issues, hot flashes or night sweats. Psych:  No mood changes, depression or anxiety. Pain:  No focal pain. Review of systems:  All other systems reviewed and found to be negative.  Physical Exam: There were no vitals taken for this visit. GENERAL:  Thin woman sitting comfortably in the exam room in no acute distress. MENTAL STATUS:  Alert and oriented to person, place and time. HEAD:  White jaw length hair.  Normocephalic, atraumatic, face symmetric, no Cushingoid   features. EYES:  Blue eyes.  Pupils equal round and reactive to light and accomodation.  No conjunctivitis or scleral icterus. ENT:  Oropharynx clear without lesion.  Tongue normal. Mucous membranes moist.  RESPIRATORY:  Clear to auscultation without rales, wheezes or rhonchi. CARDIOVASCULAR:  Regular rate and rhythm without murmur, rub or gallop. BREAST:  Right breast s/p lumpectomy.  No masses, skin changes or nipple discharge.  Left breast without masses, skin changes or nipple discharge.  ABDOMEN:   Soft, non-tender, with active bowel sounds, and no hepatosplenomegaly.  No masses. SKIN:  No rashes, ulcers or lesions. EXTREMITIES: No edema, no skin discoloration or tenderness.  No palpable cords. LYMPH NODES: No palpable cervical, supraclavicular, axillary or inguinal adenopathy  NEUROLOGICAL: Unremarkable. PSYCH:  Appropriate.   No visits with results within 3 Day(s) from this visit.  Latest known visit with results is:  Appointment on 08/25/2016  Component Date Value Ref Range Status  . WBC 08/25/2016 7.7  3.6 - 11.0 K/uL Final  . RBC 08/25/2016 4.44  3.80 - 5.20 MIL/uL Final  . Hemoglobin 08/25/2016 14.7  12.0 - 16.0 g/dL Final  . HCT 08/25/2016 42.7  35.0 - 47.0 % Final  . MCV 08/25/2016 96.1  80.0 - 100.0 fL Final  . MCH 08/25/2016 33.2  26.0 - 34.0 pg Final  . MCHC 08/25/2016 34.5  32.0 - 36.0 g/dL Final  . RDW 08/25/2016 12.7  11.5 - 14.5 % Final  . Platelets 08/25/2016 183  150 - 440 K/uL Final  . Neutrophils Relative % 08/25/2016 78  % Final  . Neutro Abs 08/25/2016 6.0  1.4 - 6.5 K/uL Final  . Lymphocytes Relative 08/25/2016 16  % Final  . Lymphs Abs 08/25/2016 1.3  1.0 - 3.6 K/uL Final  . Monocytes Relative 08/25/2016 5  % Final  . Monocytes Absolute 08/25/2016 0.4  0.2 - 0.9 K/uL Final  . Eosinophils Relative 08/25/2016 0  % Final  . Eosinophils Absolute 08/25/2016 0.0  0 - 0.7 K/uL Final  . Basophils Relative 08/25/2016 1  % Final  . Basophils Absolute 08/25/2016 0.1  0 - 0.1 K/uL Final  . Sodium 08/25/2016 134* 135 - 145 mmol/L Final  . Potassium 08/25/2016 4.1  3.5 - 5.1 mmol/L Final  . Chloride 08/25/2016 98* 101 - 111 mmol/L Final  . CO2 08/25/2016 28  22 - 32 mmol/L Final  . Glucose, Bld 08/25/2016 146* 65 - 99 mg/dL Final  . BUN 08/25/2016 19  6 - 20 mg/dL Final  . Creatinine, Ser 08/25/2016 0.70  0.44 - 1.00 mg/dL Final  . Calcium 08/25/2016 9.9  8.9 - 10.3 mg/dL Final  . Total Protein 08/25/2016 7.3  6.5 - 8.1 g/dL Final  . Albumin 08/25/2016 4.5  3.5  - 5.0 g/dL Final  . AST 08/25/2016 31  15 - 41 U/L Final  . ALT 08/25/2016 21  14 - 54 U/L Final  . Alkaline Phosphatase 08/25/2016 60  38 - 126 U/L Final  . Total Bilirubin 08/25/2016 0.7  0.3 - 1.2 mg/dL Final  . GFR calc non Af Amer 08/25/2016 >60  >60 mL/min Final  . GFR calc Af Amer 08/25/2016 >60  >60 mL/min Final   Comment: (NOTE) The eGFR has been calculated using the CKD EPI equation. This calculation has not been validated in all clinical situations. eGFR's persistently <60 mL/min signify possible Chronic Kidney Disease.   . Anion gap 08/25/2016 8  5 - 15 Final  . CA 27.29 08/25/2016 34.2  0.0 -  38.6 U/mL Final   Comment: (NOTE) [employer]/ACS methodology Performed At: [facility] [address] [place], [place] [id number] Lindon Romp MD IO:[id number]     Assessment:  Shanetra Blumenstock is a 71 y.o. female with stage IA multi-focal right breast cancer s/p partial mastectomy with sentinel lymph node biopsy on 02/14/2016.  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  Margins were negative (0.5 cm). One sentinel lymph node was negative. Tumor was ER + (71-80%), PR + (91-100%), and Her2.neu -.  Pathologic stage was T1cN0.   Oncotype DX score was 16 which translates to a distant risk of recurrence of her breast cancer at 10% with 5 years of tamoxifen and 7% with aromatase inhibitors. Without chemotherapy, recurrence is estimated at 30%.   She opted to forgo radiation.  She has declined tamoxifen and aromatase inhibitors.  CA27.29 was 34.2 on 08/25/2016.  Bone density study on 03/24/2016 revealed osteopenia with a T score of -2.3 in the AP spine and -0.5 in the femoral neck.  She states that decrease of bone density in the lumbar spine is typical of elite runners.  Symptomatically, she has had right arm stiffness and decreased mobility of her right arm since surgery.  Plan: 1.  Labs today:  CMP. 2.  Discuss diagnosis,  staging and management of lobular breast cancer.  Discuss radiation oncology consult.  Radiation was recommended if patient did not receive endocrine therapy.  Discuss Oncotype DX results and risk of recurrence with tamoxifen, aromatase inhibitors, and no therapy.  Discuss side effects of tamoxifen and aromatase inhibitors.  Patient voiced concern for potential side effects.  She stated that she wanted to be happy and live well then live for a long time with side effects.  I discussed a trial of hormonal therapy to see how she tolerates it.   2.  Discuss osteopenia and treatment with calcium, vitamin D, and a bisphosphonate or a bone modifying agent such as Prolia.  Side effects were reviewed including potential osteonecrosis of the jaw.  Discuss dental evaluation. 3.  Discuss decreased mobility in right arm since surgery.  Patient had some initial improvement with physical therapy.  Discuss reassess by PT to help with mobility exercises. 4.  Labs today:  CBC with diff, CMP, CA27.29. 5.  Schedule bilateral mammogram 12/11/2016. 6.  Preauth Prolia. 7.  Physical therapy consult. 8.  RTC in 1 month for MD assessment and labs (CMP) +/- Prolia.   Lequita Asal, MD  09/25/2016, 4:31 AM

## 2016-10-06 ENCOUNTER — Ambulatory Visit: Payer: Federal, State, Local not specified - PPO | Admitting: Hematology

## 2016-10-15 ENCOUNTER — Encounter: Payer: Self-pay | Admitting: Hematology

## 2016-10-16 ENCOUNTER — Other Ambulatory Visit: Payer: Self-pay

## 2016-10-16 DIAGNOSIS — D619 Aplastic anemia, unspecified: Secondary | ICD-10-CM

## 2016-10-16 NOTE — Progress Notes (Signed)
Entered platelet orders by mistake. Error entry. Notified bloodbank.

## 2016-10-21 NOTE — Progress Notes (Signed)
[facility]  [phone] [phone] [place] Note   Patient Care Team: Fanny Bien, MD as PCP - General (Family Medicine) Verlan Friends, MD as Referring Physician (Hematology and Oncology) Eun-Sil Juliet Rude, MD as Referring Physician (Surgical Oncology) Bonner Puna, MD as Referring Physician (Radiation Oncology) 10/27/2016  CHIEF COMPLAINTS/PURPOSE OF CONSULTATION:  Right breast cancer    Breast cancer of upper-outer quadrant of right female breast ([place])   12/12/2015 Mammogram    Screening Mammogram 12/12/15 UOQ right breast density.      12/25/2015 Mammogram    Bilateral diagnostic mammogram and [place] 12/25/15 1) Irregular mass with partially obscured spiculated margins measuring 1.7 cm in the UOQ right breast. 2) USshowed a round hypoechoic mass with indistinct margins in the right breast the 10:00 position 3 cm the nipple which measured 1.1 x 0.9 x 1.1 cm. Additionally, there was a second adjacent mass in the right breast at the 10:00 position 3 cm the nipple which measured 0.5 x 0.6 cm. US of the right axilla was negative.      01/03/2016 Initial Biopsy    Right breast biopsy 01/03/16 Biopsy of the right smaller mass revealed LCIS with no invasive carcinoma identified. Biopsy of the larger right breast mass revealed grade 2 invasive lobular carcinoma.      01/03/2016 Receptors her2    ER 71-80% +, PR 91-100% +, HER2 -      01/23/2016 Breast MRI    Bilateral breast MRI 01/23/16 Adjacent enhancing masses in the right breast at 10:00, consistent with  biopsy-proven ILC and LCIS and consistent with prior mammograms and ultrasound.The area including the two masses measured approximately 1.6 x 1.3 x 0.9 cm (although the extent of disease on ultrasound measured 3.0 cm).No evidence of additional sites of disease.      01/24/2016 Initial Diagnosis    Breast cancer of upper-outer quadrant of right female breast ([place])     02/14/2016 Surgery    Right lumpectomy and SLN biopsy 02/14/16 Lumpectomy revealed grade 2 invasive lobular carcinoma with two foci, measuring 1.4 cm and 0.6 cm in a background of lobular carcinoma in situ, negative margins. A biopsied right axillary lymph node was negative. mpT1c, pN0      02/14/2016 Oncotype testing    Oncotype DX score was 16; indicting a recurrence risk of 10% in 5 years with Tamoxifen.      03/24/2016 Imaging    Bone density study 03/24/16  Osteopenia with a T-Score of -2.3 in the AP spine and -0.5 in the femoral neck.       09/11/2016 - 10/08/2016 Anti-estrogen oral therapy    Tamoxifen 20 mg daily. Discontinued due to poor tolerance.       HISTORY OF PRESENTING ILLNESS:  Regina Vincent 71 y.o. female is here to establish care. She presents today with her partner. She wishes to transfer her oncological care to [place] due to her recent relocation to [place]. She was initially diagnosed and treated at [facility].   The patient had a bilateral screening mammogram on 12/12/15 revealing a density in the UOQ of the right breast. Bilateral diagnostic mammogram on 12/25/15 showed an irregular mass with partially obscured spiculated margins measuring 1.7 cm in the UOQ right breast. [place] at the time showed a round hypoechoic mass with indistinct margins in the right breast the 10:00 position 3 cm the nipple which measured 1.1 x 0.9 x 1.1 cm. Additionally, there was a second adjacent mass in the  right breast at the 10:00 position 3 cm the nipple which measured 0.5 x 0.6 cm. US of the right axilla was negative.  The patient underwent biopsy of the right breast on 01/03/16. Biopsy of the right smaller mass revealed LCIS with no invasive carcinoma identified. Biopsy of the larger right breast mass revealed grade 2 invasive lobular carcinoma (ER 71-80% +, PR 91-100% +, HER2 -).  The patient had MRI of the bilateral breasts on 01/23/16 at [facility]. This showed adjacent enhancing masses in the  right breast at 10:00, consistent with  biopsy-proven ILC and LCIS and consistent with prior mammograms and ultrasound.The area including the two masses measured approximately 1.6 x 1.3 x 0.9 cm (although the extent of disease on ultrasound measured 3.0 cm).No evidence of additional sites of disease.  On 02/14/16, the patient underwent a right lumpectomy and SLN biopsy by Dr. Glade Stanford. Lumpectomy revealed grade 2 invasive lobular carcinoma with two foci, measuring 1.4 cm and 0.6 cm in a background of lobular carcinoma in situ, negative margins. A biopsied right axillary lymph node was negative. mpT1c, pN0 Oncotype DX score was 16; indicting a recurrence risk of 10% in 5 years with Tamoxifen.  The patient was seen by Dr. Lamar Blinks, radiation oncologist at [facility], on 03/11/16. In general, the recommendation would include a course of hypo-fractionated whole breast radiation. Omission of radiation was associated with a higher risk of local recurrence, but no difference in survival. Recurrence rates were low overall. If the patient declined endocrine therapy, then omission of radiation would not have been recommended. The patient opted to forgo radiation in light of possibly affecting her heart and lungs.  Bone density study on 03/24/16 revealed osteopenia with a T-Score of -2.3 in the AP spine and -0.5 in the femoral neck.   She was seen by Dr. Beverley Fiedler, medical oncologist at [facility], on 04/08/16. Recommendation was for an aromatase inhibitor. The patient did not want to proceed with an aromatase inhibitor or Tamoxifen as she read the side effects on the [employer] website and was worried about cardiac disease and diabetes. 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 Recommendation was to try an aromatase inhibitor and see how well she tolerates it.  The patient relocated from [facility] to [place] and was referred to Dr. Mike Gip at [facility]. The patient learned that she could be seen closer to home at [facility] and would like to establish care here.  Dr. Mike Gip prescribed Tamoxifen 20 mg once a day on 09/11/16. The patient stopped on 10/08/16 due to mood swings (depression), night sweats, difficulty sleeping, broken nails, headaches, right arm pain, light-headedness, chills, fever, fatigue, abdominal bloating, and abdominal pain. The patient reports her symptoms have since resolved, except for fatigue.  CURRENT THERAPY: Surveillance  MEDICAL HISTORY:  Past Medical History:  Diagnosis Date  . Breast cancer [facility])     SURGICAL HISTORY: Past Surgical History:  Procedure Laterality Date  . BREAST LUMPECTOMY WITH AXILLARY LYMPH NODE BIOPSY Right   . cataract surgery   2015    SOCIAL HISTORY: Social History   Social History  . Marital status: Single    Spouse name: N/A  . Number of children: N/A  . Years of education: N/A   Occupational History  . Not on file.   Social History Main Topics  . Smoking status: Former Smoker    Years: 3.00    Quit date:  08/25/1972  . Smokeless tobacco: Never Used  . Alcohol use 1.2 oz/week    1 Glasses of wine, 1 Cans of beer per week     Comment: once a week  . Drug use: No  . Sexual activity: Yes    Birth control/ protection: Post-menopausal   Other Topics Concern  . Not on file   Social History Narrative  . No narrative on file   GYN HISTORY  Menarchal: 16 LMP: mid-50s Contraceptive: For a few years HRT: 1 year GP: G2P2  FAMILY HISTORY: Family History  Problem Relation Age of Onset  . Diabetes Mother   . Hypertension Mother   . Diabetes Father   . Hypertension Father   . Cancer Brother 39    prostate  . Cancer Brother     lung cancer    ALLERGIES:  is allergic to naproxen; shellfish-derived products; and ibuprofen.  MEDICATIONS:  Current Outpatient Prescriptions  Medication Sig Dispense Refill  . KRILL OIL PO Take 1 capsule by mouth daily.  Viva Naturals    . Multiple Vitamin (MULTIVITAMIN) tablet Take 1 tablet by mouth daily. [place] complete     No current facility-administered medications for this visit.     REVIEW OF SYSTEMS:   Constitutional: Denies fevers, chills or abnormal night sweats (+) Mild fatigue Eyes: Denies blurriness of vision, double vision or watery eyes Ears, nose, mouth, throat, and face: Denies mucositis or sore throat Respiratory: Denies cough, dyspnea or wheezes Cardiovascular: Denies palpitation, chest discomfort or lower extremity swelling Gastrointestinal:  Denies nausea, heartburn or change in bowel habits Skin: Denies abnormal skin rashes Lymphatics: Denies new lymphadenopathy or easy bruising Neurological:Denies numbness, tingling or new weaknesses Behavioral/Psych: Mood is stable, no new changes  All other systems were reviewed with the patient and are negative.  PHYSICAL EXAMINATION: ECOG PERFORMANCE STATUS: 0 - Asymptomatic  Vitals:   10/27/16 1431  BP: 138/74  Pulse: 67  Resp: 18  Temp: 98.1 F (36.7 C)   Filed Weights   10/27/16 1431  Weight: 106 lb 6.4 oz (48.3 kg)    GENERAL:alert, no distress and comfortable SKIN: skin color, texture, turgor are normal, no rashes or significant lesions EYES: normal, conjunctiva are pink and non-injected, sclera clear OROPHARYNX:no exudate, no erythema and lips, buccal mucosa, and tongue normal  NECK: supple, thyroid normal size, non-tender, without nodularity LYMPH:  no palpable lymphadenopathy in the cervical, axillary or inguinal LUNGS: clear to auscultation and percussion with normal breathing effort HEART: regular rate & rhythm and no murmurs and no lower extremity edema ABDOMEN:abdomen soft, non-tender and normal bowel sounds Musculoskeletal:no cyanosis of digits and no clubbing  PSYCH: alert & oriented x 3 with fluent speech NEURO: no focal motor/sensory deficits BREAST: Surgivcal scar in the UOQ right breast and right axilla,  healed well. No masses appreciated in either breast. Bilateral axillae negative for adenopathy  LABORATORY DATA:  I have reviewed the data as listed CBC Latest Ref Rng & Units 08/25/2016  WBC 3.6 - 11.0 K/uL 7.7  Hemoglobin 12.0 - 16.0 g/dL 14.7  Hematocrit 35.0 - 47.0 % 42.7  Platelets 150 - 440 K/uL 183    CMP Latest Ref Rng & Units 08/25/2016  Glucose 65 - 99 mg/dL 146(H)  BUN 6 - 20 mg/dL 19  Creatinine 0.44 - 1.00 mg/dL 0.70  Sodium 135 - 145 mmol/L 134(L)  Potassium 3.5 - 5.1 mmol/L 4.1  Chloride 101 - 111 mmol/L 98(L)  CO2 22 - 32 mmol/L 28  Calcium 8.9 -  10.3 mg/dL 9.9  Total Protein 6.5 - 8.1 g/dL 7.3  Total Bilirubin 0.3 - 1.2 mg/dL 0.7  Alkaline Phos 38 - 126 U/L 60  AST 15 - 41 U/L 31  ALT 14 - 54 U/L 21    Results for EVELLA, KASAL (MRN [id number]) as of 10/27/2016 15:32  Ref. Range 08/25/2016 11:00  CA 27.29 Latest Ref Range: 0.0 - 38.6 U/mL 34.2    RADIOGRAPHIC STUDIES: I have personally reviewed the radiological images as listed and agreed with the findings in the report. No results found.  ASSESSMENT & PLAN: 71 y.o. post-menopausal Caucasian woman with a screening detect right breast cancer  1. 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  -The patient had right lumpectomy and SLN biopsy in June 2017 with negative margins  -The Oncotype DX score was 16; indicating a recurrence risk of 10% in 5 years with Tamoxifen. This is low risk disease, and does not need adjuvant chemo  -The patient was offered radiation therapy, but declined due to her concerns of side effects -The patient was offered adjuvant aromatase inhibitor, but she was very concerned the side effects especially the muscular and joint discomfort from AI, and declined. -The patient took  Tamoxifen from 09/11/16 - 10/08/2016. She discontinued due to poor tolerance. -We discussed that if the patient does not take Tamoxifen or an aromatase inhibitor, then the risk of recurrence is approximately 20%. She is also at high risk for local recurrence due to the lack of adjuvant breast radiation. -I again reviewed the benefit and side effects of adjuvant aromatase inhibitor, I recommend her to try exemestane, and start from low-dose for the first month, to see if she can tolerate. -I provided the patient a handout on Letrozole and Exemestane. The patient will call when she makes a decision. -She is clinically doing very well, exam today was unremarkable, no clinical concern for recurrence. -We reviewed to see breast cancer surveillance. I strongly encouraged her to continue yearly screening mammograms, self-breast exam, and clinical exams, lab and routine follow-up with [place] -We discussed that we don't routinely do screening CT or bone scan for breast cancer recurrence, but we will obtain scans if there are clinical concerns based on her symptoms, abnormal lab test results, or exam findings.  2. Osteopenia -Bone density study on 03/24/16 revealed osteopenia with a T-Score of -2.3 in the AP spine and -0.5 in the femoral neck. -The patient reports her last Vitamin D test revealed low levels. -I advised the patient to take Calcium and Vitamin D. -I advised her to continue exercise.  PLAN -Lab and f/u in 6 months, sooner if she decides to try exemestane  -The patient will call to decide if she wants to proceed with Exemestane.  No orders of the defined types were placed in this encounter.   All questions were answered. The patient knows to call the clinic with any problems, questions or concerns. I spent 40 minutes counseling the patient face to face. The total time spent in the appointment was 45 minutes and more than 50% was on counseling.     Truitt Merle, MD 10/27/2016   This document serves  as a record of services personally performed by Truitt Merle, MD. It was created on her behalf by Darcus Austin, a trained medical scribe.  The creation of this record is based on the scribe's personal observations and the provider's statements to them. This document has been checked and approved by the attending provider.

## 2016-10-27 ENCOUNTER — Ambulatory Visit (HOSPITAL_BASED_OUTPATIENT_CLINIC_OR_DEPARTMENT_OTHER): Payer: Federal, State, Local not specified - PPO | Admitting: Hematology

## 2016-10-27 ENCOUNTER — Encounter: Payer: Self-pay | Admitting: Hematology

## 2016-10-27 VITALS — BP 138/74 | HR 67 | Temp 98.1°F | Resp 18 | Ht 62.0 in | Wt 106.4 lb

## 2016-10-27 DIAGNOSIS — Z170 Estrogen receptor positive status [ER+]: Secondary | ICD-10-CM

## 2016-10-27 DIAGNOSIS — M8588 Other specified disorders of bone density and structure, other site: Secondary | ICD-10-CM

## 2016-10-27 DIAGNOSIS — C50411 Malignant neoplasm of upper-outer quadrant of right female breast: Secondary | ICD-10-CM

## 2016-10-28 ENCOUNTER — Encounter: Payer: Self-pay | Admitting: Hematology

## 2016-10-29 ENCOUNTER — Telehealth: Payer: Self-pay | Admitting: Hematology

## 2016-10-29 NOTE — Telephone Encounter (Signed)
Appointments scheduled per 3/13 LOS. Called patient to inform of next scheduled appointments. LVM

## 2016-11-10 ENCOUNTER — Encounter: Payer: Self-pay | Admitting: Hematology

## 2016-11-11 ENCOUNTER — Encounter: Payer: Self-pay | Admitting: Hematology and Oncology

## 2016-11-12 ENCOUNTER — Other Ambulatory Visit: Payer: Self-pay | Admitting: Hematology

## 2017-01-28 ENCOUNTER — Other Ambulatory Visit: Payer: Self-pay | Admitting: Family Medicine

## 2017-01-28 DIAGNOSIS — N63 Unspecified lump in unspecified breast: Secondary | ICD-10-CM

## 2017-02-02 ENCOUNTER — Ambulatory Visit
Admission: RE | Admit: 2017-02-02 | Discharge: 2017-02-02 | Disposition: A | Discharge status: Home / Self Care | Payer: Federal, State, Local not specified - PPO | Source: Ambulatory Visit | Attending: Family Medicine | Admitting: Family Medicine

## 2017-02-02 DIAGNOSIS — N63 Unspecified lump in unspecified breast: Secondary | ICD-10-CM

## 2017-04-30 ENCOUNTER — Other Ambulatory Visit: Payer: Federal, State, Local not specified - PPO

## 2017-04-30 ENCOUNTER — Ambulatory Visit: Payer: Federal, State, Local not specified - PPO | Admitting: Hematology

## 2017-05-21 ENCOUNTER — Other Ambulatory Visit: Payer: Self-pay

## 2017-05-21 NOTE — Progress Notes (Signed)
Regina Vincent  [phone]) [phone] Fax:(336) [phone]  Clinic Follow Up Note   Patient Care Team: Fanny Bien, MD as PCP - General (Family Medicine) Juanita Craver Gerrie Nordmann, MD as Referring Physician (Hematology and Oncology) Sheliah Hatch, MD as Referring Physician (Surgical Oncology) Bonner Puna, MD as Referring Physician (Radiation Oncology) Gardenia Phlegm, NP as Nurse Practitioner (Hematology and Oncology) 05/24/2017  CHIEF COMPLAINTS:  Follow up right breast cancer    Breast cancer of upper-outer quadrant of right female breast ([facility])   12/12/2015 Mammogram    Screening Mammogram 12/12/15 UOQ right breast density.      12/25/2015 Mammogram    Bilateral diagnostic mammogram and [place] 12/25/15 1) Irregular mass with partially obscured spiculated margins measuring 1.7 cm in the UOQ right breast. 2) USshowed a round hypoechoic mass with indistinct margins in the right breast the 10:00 position 3 cm the nipple which measured 1.1 x 0.9 x 1.1 cm. Additionally, there was a second adjacent mass in the right breast at the 10:00 position 3 cm the nipple which measured 0.5 x 0.6 cm. US of the right axilla was negative.      01/03/2016 Initial Biopsy    Right breast biopsy 01/03/16 Biopsy of the right smaller mass revealed LCIS with no invasive carcinoma identified. Biopsy of the larger right breast mass revealed grade 2 invasive lobular carcinoma.      01/03/2016 Receptors her2    ER 71-80% +, PR 91-100% +, HER2 -      01/23/2016 Breast MRI    Bilateral breast MRI 01/23/16 Adjacent enhancing masses in the right breast at 10:00, consistent with  biopsy-proven ILC and LCIS and consistent with prior mammograms and ultrasound.The area including the two masses measured approximately 1.6 x 1.3 x 0.9 cm (although the extent of disease on ultrasound measured 3.0 cm).No evidence of additional sites of disease.      01/24/2016 Initial Diagnosis    Breast cancer  of upper-outer quadrant of right female breast ([place])     02/14/2016 Surgery    Right lumpectomy and SLN biopsy 02/14/16 Lumpectomy revealed grade 2 invasive lobular carcinoma with two foci, measuring 1.4 cm and 0.6 cm in a background of lobular carcinoma in situ, negative margins. A biopsied right axillary lymph node was negative. mpT1c, pN0      02/14/2016 Oncotype testing    Oncotype DX score was 16; indicting a recurrence risk of 10% in 5 years with Tamoxifen.      03/24/2016 Imaging    Bone density study 03/24/16  Osteopenia with a T-Score of -2.3 in the AP spine and -0.5 in the femoral neck.       09/11/2016 - 10/08/2016 Anti-estrogen oral therapy    Tamoxifen 20 mg daily. Discontinued due to poor tolerance.       HISTORY OF PRESENTING ILLNESS:  Regina Vincent 71 y.o. female is here to establish care. She presents today with her partner. She wishes to transfer her oncological care to [place] due to her recent relocation to [place]. She was initially diagnosed and treated at [facility].   The patient had a bilateral screening mammogram on 12/12/15 revealing a density in the UOQ of the right breast. Bilateral diagnostic mammogram on 12/25/15 showed an irregular mass with partially obscured spiculated margins measuring 1.7 cm in the UOQ right breast. [place] at the time showed a round hypoechoic mass with indistinct margins in the right breast the 10:00 position 3 cm the nipple which measured 1.1 x 0.9 x  1.1 cm. Additionally, there was a second adjacent mass in the right breast at the 10:00 position 3 cm the nipple which measured 0.5 x 0.6 cm. US of the right axilla was negative.  The patient underwent biopsy of the right breast on 01/03/16. Biopsy of the right smaller mass revealed LCIS with no invasive carcinoma identified. Biopsy of the larger right breast mass revealed grade 2 invasive lobular carcinoma (ER 71-80% +, PR 91-100% +, HER2 -).  The patient had MRI of the bilateral breasts on 01/23/16 at [facility]. This showed adjacent enhancing masses in the right breast at 10:00, consistent with  biopsy-proven ILC and LCIS and consistent with prior mammograms and ultrasound.The area including the two masses measured approximately 1.6 x 1.3 x 0.9 cm (although the extent of disease on ultrasound measured 3.0 cm).No evidence of additional sites of disease.  On 02/14/16, the patient underwent a right lumpectomy and SLN biopsy by Dr. Hwang. Lumpectomy revealed grade 2 invasive lobular carcinoma with two foci, measuring 1.4 cm and 0.6 cm in a background of lobular carcinoma in situ, negative margins. A biopsied right axillary lymph node was negative. mpT1c, pN0 Oncotype DX score was 16; indicting a recurrence risk of 10% in 5 years with Tamoxifen.  The patient was seen by Dr. Suneja, radiation oncologist at [facility], on 03/11/16. In general, the recommendation would include a course of hypo-fractionated whole breast radiation. Omission of radiation was associated with a higher risk of local recurrence, but no difference in survival. Recurrence rates were low overall. If the patient declined endocrine therapy, then omission of radiation would not have been recommended. The patient opted to forgo radiation in light of possibly affecting her heart and lungs.  Bone density study on 03/24/16 revealed osteopenia with a T-Score of -2.3 in the AP spine and -0.5 in the femoral neck.   She was seen by Dr. Marcum, medical oncologist at [facility], on 04/08/16. Recommendation was for an aromatase inhibitor. The patient did not want to proceed with an aromatase inhibitor or Tamoxifen as she read the side effects on the [employer] website and was worried about cardiac disease and diabetes. 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 Recommendation was to try an aromatase inhibitor and see how well she tolerates it.  The patient relocated from [place] to [place]  and was referred to Dr. Corcoran at [facility]. The patient learned that she could be seen closer to home at [facility] and would like to establish care here.  Dr. Corcoran prescribed Tamoxifen 20 mg once a day on 09/11/16. The patient stopped on 10/08/16 due to mood swings (depression), night sweats, difficulty sleeping, broken nails, headaches, right arm pain, light-headedness, chills, fever, fatigue, abdominal bloating, and abdominal pain. The patient reports her symptoms have since resolved, except for fatigue.     CURRENT THERAPY: Surveillance   INTERVAL HISTORY:  Regina Vincent is here for a follow up of her right breast cancer. She was last seen by me 6 months ago. She presents to the clinic today reporting she feels good. She reports to having better liver enzymes in 01/2017 since stopping tamoxifen. The pain in her arm and shoulder has resolved with acupuncture at [facility] on [address]. She remains active with running and walking and weight baring exercis. She denies any new pain or discomfort. She declined any AI or further treatment. She sees her PCP, Dr. Dewey, in   March yearly. She notes to taking her Vitamin D for her regularly for her osteopenia. She does not take calcium but drinks plenty of milk. She is not inclined to use Calcium due to family history of heart disease.    MEDICAL HISTORY:  Past Medical History:  Diagnosis Date  . Breast cancer ([facility])     SURGICAL HISTORY: Past Surgical History:  Procedure Laterality Date  . BREAST LUMPECTOMY    . BREAST LUMPECTOMY WITH AXILLARY LYMPH NODE BIOPSY Right   . cataract surgery   2015    SOCIAL HISTORY: Social History   Social History  . Marital status: Single    Spouse name: N/A  . Number of children: N/A  . Years of education: N/A   Occupational History  . Not on file.   Social History Main Topics  . Smoking status: Former Smoker    Years: 3.00    Quit date: 08/25/1972  . Smokeless tobacco:  Never Used  . Alcohol use 1.2 oz/week    1 Glasses of wine, 1 Cans of beer per week     Comment: once a week  . Drug use: No  . Sexual activity: Yes    Birth control/ protection: Post-menopausal   Other Topics Concern  . Not on file   Social History Narrative  . No narrative on file   GYN HISTORY  Menarchal: 16 LMP: mid-50s Contraceptive: For a few years HRT: 1 year GP: G2P2  FAMILY HISTORY: Family History  Problem Relation Age of Onset  . Diabetes Mother   . Hypertension Mother   . Diabetes Father   . Hypertension Father   . Cancer Brother 70       prostate  . Cancer Brother        lung cancer    ALLERGIES:  is allergic to naproxen; shellfish-derived products; and ibuprofen.  MEDICATIONS:  Current Outpatient Prescriptions  Medication Sig Dispense Refill  . Multiple Vitamin (MULTIVITAMIN) tablet Take 1 tablet by mouth daily. Cooper complete    . KRILL OIL PO Take 1 capsule by mouth daily. Viva Naturals     No current facility-administered medications for this visit.     REVIEW OF SYSTEMS:   Constitutional: Denies fevers, chills or abnormal night sweats  Eyes: Denies blurriness of vision, double vision or watery eyes Ears, nose, mouth, throat, and face: Denies mucositis or sore throat Respiratory: Denies cough, dyspnea or wheezes Cardiovascular: Denies palpitation, chest discomfort or lower extremity swelling Gastrointestinal:  Denies nausea, heartburn or change in bowel habits Skin: Denies abnormal skin rashes Lymphatics: Denies new lymphadenopathy or easy bruising Neurological:Denies numbness, tingling or new weaknesses Behavioral/Psych: Mood is stable, no new changes  All other systems were reviewed with the patient and are negative.  PHYSICAL EXAMINATION: ECOG PERFORMANCE STATUS: 0 - Asymptomatic  Vitals:   05/24/17 1357  BP: 135/86  Pulse: 74  Resp: 18  Temp: 98.2 F (36.8 C)  SpO2: 100%   Filed Weights   05/24/17 1357  Weight: 102 lb 11.2  oz (46.6 kg)    GENERAL:alert, no distress and comfortable SKIN: skin color, texture, turgor are normal, no rashes or significant lesions EYES: normal, conjunctiva are pink and non-injected, sclera clear OROPHARYNX:no exudate, no erythema and lips, buccal mucosa, and tongue normal  NECK: supple, thyroid normal size, non-tender, without nodularity LYMPH:  no palpable lymphadenopathy in the cervical, axillary or inguinal LUNGS: clear to auscultation and percussion with normal breathing effort HEART: regular rate & rhythm and no murmurs and   no lower extremity edema ABDOMEN:abdomen soft, non-tender and normal bowel sounds, no hepatomegaly  Musculoskeletal:no cyanosis of digits and no clubbing  PSYCH: alert & oriented x 3 with fluent speech NEURO: no focal motor/sensory deficits BREAST: Surgivcal scar in the UOQ right breast and right axilla, healed well. No masses appreciated in either breast. Bilateral axillae negative for adenopathy  LABORATORY DATA:  I have reviewed the data as listed CBC Latest Ref Rng & Units 05/24/2017 08/25/2016  WBC 3.9 - 10.3 10e3/uL 8.3 7.7  Hemoglobin 11.6 - 15.9 g/dL 14.3 14.7  Hematocrit 34.8 - 46.6 % 42.0 42.7  Platelets 145 - 400 10e3/uL 210 183    CMP Latest Ref Rng & Units 05/24/2017 08/25/2016  Glucose 70 - 140 mg/dl 103 146(H)  BUN 7.0 - 26.0 mg/dL 19.9 19  Creatinine 0.6 - 1.1 mg/dL 0.9 0.70  Sodium 136 - 145 mEq/L 138 134(L)  Potassium 3.5 - 5.1 mEq/L 4.6 4.1  Chloride 101 - 111 mmol/L - 98(L)  CO2 22 - 29 mEq/L 29 28  Calcium 8.4 - 10.4 mg/dL 10.1 9.9  Total Protein 6.4 - 8.3 g/dL 7.4 7.3  Total Bilirubin 0.20 - 1.20 mg/dL 0.79 0.7  Alkaline Phos 40 - 150 U/L 62 60  AST 5 - 34 U/L 54(H) 31  ALT 0 - 55 U/L 39 21    Results for Vincent, Regina (MRN [id number]) as of 10/27/2016 15:32  Ref. Range 08/25/2016 11:00  CA 27.29 Latest Ref Range: 0.0 - 38.6 U/mL 34.2    RADIOGRAPHIC STUDIES: I have personally reviewed the radiological images as listed  and agreed with the findings in the report. No results found.  ASSESSMENT & PLAN: 71 y.o. post-menopausal Caucasian woman with a screening detect right breast cancer  1. 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  -The patient had right lumpectomy and SLN biopsy in June 2017 with negative margins  -The Oncotype DX score was 16; indicating a recurrence risk of 10% in 5 years with Tamoxifen. This is low risk disease, and does not need adjuvant chemo  -The patient was offered radiation therapy, but declined due to her concerns of side effects -The patient was offered adjuvant aromatase inhibitor, but she was very concerned the side effects especially the muscular and joint discomfort from AI, and declined. -The patient took Tamoxifen from 09/11/16 - 10/08/2016. She discontinued due to poor tolerance. -We'll continue breast cancer surveillance. -She is clinically doing well, asymptomatic, physical exam was unremarkable. No clinical concern for recurrence -Labs reviewed, her CBC and CMP are within normal limits except her AST is 54, which is slightly elevated. She has had mild intermittent transaminitis, overall stable. Will monitor with future labs. 01/2017 mammogram was normal. There is no clinical concern for recurrence.  -I will see her back in 1 year. She knows to contact us if she feels or sees and new breast change.    2. Osteopenia -Bone density study on 03/24/16 revealed osteopenia with a T-Score of -2.3 in the AP spine and -0.5 in the femoral neck. -The patient reports her last Vitamin D test revealed low levels. -I advised the patient to take Calcium in addition to her Vitamin D. She rather take her calcium from food sources, declined calcium oral pills  -I advised her to continue  exercise with   weights.   PLAN -Lab and f/u in 1 year -Mammogram in 01/2018 at [facility]    Orders Placed This Encounter  Procedures  . MM DIAG BREAST TOMO BILATERAL    Standing Status:   Future    Standing Expiration Date:   05/24/2018    Order Specific Question:   Reason for Exam (SYMPTOM  OR DIAGNOSIS REQUIRED)    Answer:   screning    Order Specific Question:   Preferred imaging location?    Answer:   [facility]  . CBC with Differential    Standing Status:   Standing    Number of Occurrences:   30    Standing Expiration Date:   05/24/2022  . Comprehensive metabolic panel    Standing Status:   Standing    Number of Occurrences:   30    Standing Expiration Date:   05/24/2022    All questions were answered. The patient knows to call the clinic with any problems, questions or concerns. I spent 15 minutes counseling the patient face to face. The total time spent in the appointment was 20 minutes and more than 50% was on counseling.     Feng, Yan, MD 05/24/2017   This document serves as a record of services personally performed by Yan Feng, MD. It was created on her behalf by Amoya Bennett, a trained medical scribe. The creation of this record is based on the scribe's personal observations and the provider's statements to them. This document has been checked and approved by the attending provider.  

## 2017-05-24 ENCOUNTER — Ambulatory Visit (HOSPITAL_BASED_OUTPATIENT_CLINIC_OR_DEPARTMENT_OTHER): Payer: Federal, State, Local not specified - PPO | Admitting: Hematology

## 2017-05-24 ENCOUNTER — Other Ambulatory Visit (HOSPITAL_BASED_OUTPATIENT_CLINIC_OR_DEPARTMENT_OTHER): Payer: Federal, State, Local not specified - PPO

## 2017-05-24 ENCOUNTER — Encounter: Payer: Self-pay | Admitting: Hematology

## 2017-05-24 ENCOUNTER — Telehealth: Payer: Self-pay | Admitting: Hematology

## 2017-05-24 VITALS — BP 135/86 | HR 74 | Temp 98.2°F | Resp 18 | Ht 62.0 in | Wt 102.7 lb

## 2017-05-24 DIAGNOSIS — C50411 Malignant neoplasm of upper-outer quadrant of right female breast: Secondary | ICD-10-CM | POA: Diagnosis not present

## 2017-05-24 DIAGNOSIS — Z170 Estrogen receptor positive status [ER+]: Secondary | ICD-10-CM | POA: Diagnosis not present

## 2017-05-24 DIAGNOSIS — M8580 Other specified disorders of bone density and structure, unspecified site: Secondary | ICD-10-CM

## 2017-05-24 LAB — COMPREHENSIVE METABOLIC PANEL
ALT: 39 U/L (ref 0–55)
AST: 54 U/L — ABNORMAL HIGH (ref 5–34)
Albumin: 4.3 g/dL (ref 3.5–5.0)
Alkaline Phosphatase: 62 U/L (ref 40–150)
Anion Gap: 10 mEq/L (ref 3–11)
BUN: 19.9 mg/dL (ref 7.0–26.0)
CO2: 29 mEq/L (ref 22–29)
Calcium: 10.1 mg/dL (ref 8.4–10.4)
Chloride: 100 mEq/L (ref 98–109)
Creatinine: 0.9 mg/dL (ref 0.6–1.1)
EGFR: 69 mL/min/{1.73_m2} — ABNORMAL LOW (ref >90)
Glucose: 103 mg/dl (ref 70–140)
Potassium: 4.6 mEq/L (ref 3.5–5.1)
Sodium: 138 mEq/L (ref 136–145)
Total Bilirubin: 0.79 mg/dL (ref 0.20–1.20)
Total Protein: 7.4 g/dL (ref 6.4–8.3)

## 2017-05-24 LAB — CBC WITH DIFFERENTIAL/PLATELET
BASO%: 0.9 % (ref 0.0–2.0)
Basophils Absolute: 0.1 10*3/uL (ref 0.0–0.1)
EOS%: 0.7 % (ref 0.0–7.0)
Eosinophils Absolute: 0.1 10*3/uL (ref 0.0–0.5)
HCT: 42 % (ref 34.8–46.6)
HGB: 14.3 g/dL (ref 11.6–15.9)
LYMPH%: 24.1 % (ref 14.0–49.7)
MCH: 33.1 pg (ref 25.1–34.0)
MCHC: 34.1 g/dL (ref 31.5–36.0)
MCV: 97.3 fL (ref 79.5–101.0)
MONO#: 0.4 10*3/uL (ref 0.1–0.9)
MONO%: 4.3 % (ref 0.0–14.0)
NEUT#: 5.8 10*3/uL (ref 1.5–6.5)
NEUT%: 70 % (ref 38.4–76.8)
Platelets: 210 10*3/uL (ref 145–400)
RBC: 4.31 10*6/uL (ref 3.70–5.45)
RDW: 13.3 % (ref 11.2–14.5)
WBC: 8.3 10*3/uL (ref 3.9–10.3)
lymph#: 2 10*3/uL (ref 0.9–3.3)

## 2017-05-24 NOTE — Telephone Encounter (Signed)
Scheduled appt per 10/8 los - Gave patient AVS and calender per los.  

## 2017-05-26 ENCOUNTER — Encounter: Payer: Self-pay | Admitting: Hematology

## 2017-09-21 ENCOUNTER — Telehealth: Payer: Self-pay | Admitting: Hematology

## 2017-09-21 NOTE — Telephone Encounter (Signed)
FAXED RECORDS TO DR [facility] RELEASE ID [id number]

## 2017-10-07 ENCOUNTER — Other Ambulatory Visit: Payer: Self-pay | Admitting: Family Medicine

## 2017-10-07 DIAGNOSIS — Z780 Asymptomatic menopausal state: Secondary | ICD-10-CM

## 2017-10-08 ENCOUNTER — Other Ambulatory Visit: Payer: Self-pay | Admitting: Family Medicine

## 2017-10-08 DIAGNOSIS — E2839 Other primary ovarian failure: Secondary | ICD-10-CM

## 2017-10-08 DIAGNOSIS — M8580 Other specified disorders of bone density and structure, unspecified site: Secondary | ICD-10-CM

## 2017-11-04 ENCOUNTER — Other Ambulatory Visit: Payer: Self-pay | Admitting: Family Medicine

## 2017-11-04 DIAGNOSIS — Z1231 Encounter for screening mammogram for malignant neoplasm of breast: Secondary | ICD-10-CM

## 2017-12-02 ENCOUNTER — Ambulatory Visit
Admission: RE | Admit: 2017-12-02 | Discharge: 2017-12-02 | Disposition: A | Discharge status: Home / Self Care | Payer: Federal, State, Local not specified - PPO | Source: Ambulatory Visit | Attending: Family Medicine | Admitting: Family Medicine

## 2017-12-02 DIAGNOSIS — E2839 Other primary ovarian failure: Secondary | ICD-10-CM

## 2017-12-02 DIAGNOSIS — M8580 Other specified disorders of bone density and structure, unspecified site: Secondary | ICD-10-CM

## 2017-12-02 DIAGNOSIS — Z1231 Encounter for screening mammogram for malignant neoplasm of breast: Secondary | ICD-10-CM

## 2017-12-14 ENCOUNTER — Other Ambulatory Visit: Payer: Self-pay | Admitting: Family Medicine

## 2017-12-14 DIAGNOSIS — M5412 Radiculopathy, cervical region: Secondary | ICD-10-CM

## 2017-12-20 ENCOUNTER — Ambulatory Visit
Admission: RE | Admit: 2017-12-20 | Discharge: 2017-12-20 | Disposition: A | Discharge status: Home / Self Care | Payer: Federal, State, Local not specified - PPO | Source: Ambulatory Visit | Attending: Family Medicine | Admitting: Family Medicine

## 2017-12-20 DIAGNOSIS — M5412 Radiculopathy, cervical region: Secondary | ICD-10-CM

## 2018-03-07 ENCOUNTER — Encounter: Payer: Self-pay | Admitting: Hematology

## 2018-05-23 ENCOUNTER — Ambulatory Visit: Payer: Federal, State, Local not specified - PPO | Admitting: Hematology

## 2018-05-23 ENCOUNTER — Other Ambulatory Visit: Payer: Federal, State, Local not specified - PPO

## 2018-05-23 ENCOUNTER — Telehealth: Payer: Self-pay | Admitting: Hematology

## 2018-05-23 NOTE — Telephone Encounter (Signed)
LMVM for patient with date/ time of rescheduled appt per 10/7 phone message

## 2018-05-26 ENCOUNTER — Telehealth: Payer: Self-pay | Admitting: Hematology

## 2018-05-26 NOTE — Telephone Encounter (Signed)
Appts /rs as requested per patient phone message from 10/9

## 2018-06-10 ENCOUNTER — Other Ambulatory Visit: Payer: Federal, State, Local not specified - PPO

## 2018-06-10 ENCOUNTER — Ambulatory Visit: Payer: Federal, State, Local not specified - PPO | Admitting: Hematology

## 2018-06-13 ENCOUNTER — Encounter: Payer: Self-pay | Admitting: Nurse Practitioner

## 2018-06-13 ENCOUNTER — Inpatient Hospital Stay: Payer: Federal, State, Local not specified - PPO | Admitting: Nurse Practitioner

## 2018-06-13 ENCOUNTER — Inpatient Hospital Stay: Payer: Federal, State, Local not specified - PPO | Attending: Hematology

## 2018-06-13 VITALS — BP 110/60 | HR 68 | Temp 98.5°F | Resp 17 | Ht 62.0 in | Wt 104.0 lb

## 2018-06-13 DIAGNOSIS — C50411 Malignant neoplasm of upper-outer quadrant of right female breast: Secondary | ICD-10-CM

## 2018-06-13 DIAGNOSIS — Z170 Estrogen receptor positive status [ER+]: Secondary | ICD-10-CM | POA: Diagnosis not present

## 2018-06-13 DIAGNOSIS — M8588 Other specified disorders of bone density and structure, other site: Secondary | ICD-10-CM

## 2018-06-13 DIAGNOSIS — M8580 Other specified disorders of bone density and structure, unspecified site: Secondary | ICD-10-CM | POA: Insufficient documentation

## 2018-06-13 LAB — CBC WITH DIFFERENTIAL/PLATELET
Abs Immature Granulocytes: 0.01 10*3/uL (ref 0.00–0.07)
Basophils Absolute: 0 10*3/uL (ref 0.0–0.1)
Basophils Relative: 1 %
Eosinophils Absolute: 0 10*3/uL (ref 0.0–0.5)
Eosinophils Relative: 1 %
HCT: 41.4 % (ref 36.0–46.0)
Hemoglobin: 14.1 g/dL (ref 12.0–15.0)
Immature Granulocytes: 0 %
Lymphocytes Relative: 24 %
Lymphs Abs: 1.4 10*3/uL (ref 0.7–4.0)
MCH: 32.9 pg (ref 26.0–34.0)
MCHC: 34.1 g/dL (ref 30.0–36.0)
MCV: 96.7 fL (ref 80.0–100.0)
Monocytes Absolute: 0.3 10*3/uL (ref 0.1–1.0)
Monocytes Relative: 6 %
Neutro Abs: 3.9 10*3/uL (ref 1.7–7.7)
Neutrophils Relative %: 68 %
Platelets: 193 10*3/uL (ref 150–400)
RBC: 4.28 MIL/uL (ref 3.87–5.11)
RDW: 12.4 % (ref 11.5–15.5)
WBC: 5.7 10*3/uL (ref 4.0–10.5)
nRBC: 0 % (ref 0.0–0.2)

## 2018-06-13 LAB — COMPREHENSIVE METABOLIC PANEL
ALT: 32 U/L (ref 0–44)
AST: 33 U/L (ref 15–41)
Albumin: 4 g/dL (ref 3.5–5.0)
Alkaline Phosphatase: 59 U/L (ref 38–126)
Anion gap: 7 (ref 5–15)
BUN: 16 mg/dL (ref 8–23)
CO2: 28 mmol/L (ref 22–32)
Calcium: 9.3 mg/dL (ref 8.9–10.3)
Chloride: 101 mmol/L (ref 98–111)
Creatinine, Ser: 0.82 mg/dL (ref 0.44–1.00)
GFR calc Af Amer: >60 mL/min (ref >60)
GFR calc non Af Amer: >60 mL/min (ref >60)
Glucose, Bld: 100 mg/dL — ABNORMAL HIGH (ref 70–99)
Potassium: 4.1 mmol/L (ref 3.5–5.1)
Sodium: 136 mmol/L (ref 135–145)
Total Bilirubin: 1 mg/dL (ref 0.3–1.2)
Total Protein: 6.9 g/dL (ref 6.5–8.1)

## 2018-06-13 NOTE — Progress Notes (Signed)
[place]  [phone] [phone]  Clinic Follow up Note   Patient Care Team: Fanny Bien, MD as PCP - General (Family Medicine) Juanita Craver, Gerrie Nordmann, MD as Referring Physician (Hematology and Oncology) Sheliah Hatch, MD as Referring Physician (Surgical Oncology) Bonner Puna, MD as Referring Physician (Radiation Oncology) Gardenia Phlegm, NP as Nurse Practitioner (Hematology and Oncology) 06/13/2018  SUMMARY OF ONCOLOGIC HISTORY: Oncology History   Cancer Staging Breast cancer of upper-outer quadrant of right female breast [facility]) Staging form: Breast, AJCC 7th Edition - Pathologic stage from 02/13/2017: Stage IA (T1c(m), N0, cM0) - Signed by Truitt Merle, MD on 10/27/2016       Breast cancer of upper-outer quadrant of right female breast ([place])   12/12/2015 Mammogram    Screening Mammogram 12/12/15 UOQ right breast density.    12/25/2015 Mammogram    Bilateral diagnostic mammogram and [place] 12/25/15 1) Irregular mass with partially obscured spiculated margins measuring 1.7 cm in the UOQ right breast. 2) USshowed a round hypoechoic mass with indistinct margins in the right breast the 10:00 position 3 cm the nipple which measured 1.1 x 0.9 x 1.1 cm. Additionally, there was a second adjacent mass in the right breast at the 10:00 position 3 cm the nipple which measured 0.5 x 0.6 cm. US of the right axilla was negative.    01/03/2016 Initial Biopsy    Right breast biopsy 01/03/16 Biopsy of the right smaller mass revealed LCIS with no invasive carcinoma identified. Biopsy of the larger right breast mass revealed grade 2 invasive lobular carcinoma.    01/03/2016 Receptors her2    ER 71-80% +, PR 91-100% +, HER2 -    01/23/2016 Breast MRI    Bilateral breast MRI 01/23/16 Adjacent enhancing masses in the right breast at 10:00, consistent with  biopsy-proven ILC and LCIS and consistent with prior mammograms and ultrasound.The area including  the two masses measured approximately 1.6 x 1.3 x 0.9 cm (although the extent of disease on ultrasound measured 3.0 cm).No evidence of additional sites of disease.    01/24/2016 Initial Diagnosis    Breast cancer of upper-outer quadrant of right female breast ([place])    02/14/2016 Surgery    Right lumpectomy and SLN biopsy 02/14/16 Lumpectomy revealed grade 2 invasive lobular carcinoma with two foci, measuring 1.4 cm and 0.6 cm in a background of lobular carcinoma in situ, negative margins. A biopsied right axillary lymph node was negative. mpT1c, pN0    02/14/2016 Oncotype testing    Oncotype DX score was 16; indicting a recurrence risk of 10% in 5 years with Tamoxifen.    03/24/2016 Imaging    Bone density study 03/24/16  Osteopenia with a T-Score of -2.3 in the AP spine and -0.5 in the femoral neck.     09/11/2016 - 10/08/2016 Anti-estrogen oral therapy    Tamoxifen 20 mg daily. Discontinued due to poor tolerance.    02/16/2018 Mammogram    02/16/2018 Mammogram FINDINGS: The breasts are heterogeneously dense, which may obscure small masses.  At the surgical site in the right breast, there is density and  architectural distortion, stable compared to the prior films and  consistent with a post surgical scar. The lumpectomy site shows no  mammographic evidence of recurrent malignancy. There are no suspicious  masses, calcifications, or other findings in either breast.    CURRENT THERAPY: surveillance   INTERVAL HISTORY: Ms. Calvario returns for follow up as scheduled. She was last seen in 05/2017 for surveillance.  She denies changes in her health over the last year. She feels well. Denies fatigue or decreased appetite. She is very physically active with running and bike riding. She has a half marathon coming up next month in [place], [place]. Mammogram was done at [facility] in 02/2018 after difficulty getting it done at breast center. Denies changes in her breasts, new lump or discharge. Denies n/v/c/d,  fever, chills, cough, chest pain, or dyspnea. She received the flu vaccine this year.    MEDICAL HISTORY:  Past Medical History:  Diagnosis Date  . Breast cancer [facility])     SURGICAL HISTORY: Past Surgical History:  Procedure Laterality Date  . BREAST LUMPECTOMY    . BREAST LUMPECTOMY WITH AXILLARY LYMPH NODE BIOPSY Right   . cataract surgery   2015    I have reviewed the social history and family history with the patient and they are unchanged from previous note.  ALLERGIES:  is allergic to naproxen; shellfish-derived products; and ibuprofen.  MEDICATIONS:  Current Outpatient Medications  Medication Sig Dispense Refill  . aspirin 81 MG chewable tablet Chew by mouth.    . Multiple Vitamin (MULTIVITAMIN) tablet Take 1 tablet by mouth daily. [place] complete     No current facility-administered medications for this visit.     PHYSICAL EXAMINATION: ECOG PERFORMANCE STATUS: 0 - Asymptomatic  Vitals:   06/13/18 1115  BP: 110/60  Pulse: 68  Resp: 17  Temp: 98.5 F (36.9 C)  SpO2: 100%   Filed Weights   06/13/18 1115  Weight: 104 lb (47.2 kg)    GENERAL:alert, no distress and comfortable SKIN:  no rashes or significant lesions EYES: sclera clear OROPHARYNX:no thrush or ulcers LYMPH:  no palpable cervical, supraclavicular lymphadenopathy LUNGS: clear to auscultation with normal breathing effort HEART: regular rate & rhythm, no lower extremity edema ABDOMEN:abdomen soft, non-tender and normal bowel sounds Musculoskeletal:no cyanosis of digits and no clubbing  NEURO: alert & oriented x 3 with fluent speech, no focal motor/sensory deficits BREAST EXAM: inspection shows them to be symmetrical without nipple discharge or inversion. Right breast s/p lumpectomy, incision is well healed. No palpable mass in either breast or axillar that I could appreciate.    LABORATORY DATA:  I have reviewed the data as listed CBC Latest Ref Rng & Units 06/13/2018 05/24/2017 08/25/2016  WBC  4.0 - 10.5 K/uL 5.7 8.3 7.7  Hemoglobin 12.0 - 15.0 g/dL 14.1 14.3 14.7  Hematocrit 36.0 - 46.0 % 41.4 42.0 42.7  Platelets 150 - 400 K/uL 193 210 183     CMP Latest Ref Rng & Units 06/13/2018 05/24/2017 08/25/2016  Glucose 70 - 99 mg/dL 100(H) 103 146(H)  BUN 8 - 23 mg/dL 16 19.9 19  Creatinine 0.44 - 1.00 mg/dL 0.82 0.9 0.70  Sodium 135 - 145 mmol/L 136 138 134(L)  Potassium 3.5 - 5.1 mmol/L 4.1 4.6 4.1  Chloride 98 - 111 mmol/L 101 - 98(L)  CO2 22 - 32 mmol/L _0 Calcium 8.9 - 10.3 mg/dL 9.3 10.1 9.9  Total Protein 6.5 - 8.1 g/dL 6.9 7.4 7.3  Total Bilirubin 0.3 - 1.2 mg/dL 1.0 0.79 0.7  Alkaline Phos 38 - 126 U/L 59 62 60  AST 15 - 41 U/L 33 54(H) 31  ALT 0 - 44 U/L 32 39 21      RADIOGRAPHIC STUDIES: I have personally reviewed the radiological images as listed and agreed with the findings in the report. No results found.   ASSESSMENT & PLAN: 72 y.o. post-menopausal  Caucasian woman with a screening detected right breast cancer  1. 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 This is low risk disease, and does not need adjuvant chemo  -The patient was offered radiation therapy, but declined due to her concerns of side effects -The patient was offered adjuvant aromatase inhibitor, and declined due to side effect profile. -The patient took Tamoxifen from 09/11/16 - 10/08/2016. She discontinued due to poor tolerance, particularly what she reports as elevated liver enzymes. -She is clinically doing well, physical exam today is unremarkable. Labs are normal. 02/2018 mammogram is normal. No clinical concern for recurrence -Will continue breast cancer surveillance and see her back in 1 year. She  staggers her PCP visit, mammogram, and f/u with [place] so she is seen every few months.  -She knows to contact us if she feels or sees and new breast change.    2. Osteopenia -Bone density study on 03/24/16 revealed osteopenia with a T-Score of -2.3 in the AP spine and -0.5 in the femoral neck. -calcium is 9.3, she is not on oral supplement.   -She engages in weight bearing exercises regularly. I recommend repeat DEXA this year, she will get it done at [facility] due to her difficulty scheduling imaging at the breast center   PLAN -Lab, mammogram reviewed; no clinical concern for recurrence -Continue breast cancer surveillance -Mammogram due 02/2019 -DEXA due this year in 2019, will get this done at [facility] -Return for lab, f/u with Dr. Burr Medico in 1 year  All questions were answered. The patient knows to call the clinic with any problems, questions or concerns. No barriers to learning was detected.     Alla Feeling, NP 06/13/18

## 2018-11-14 IMAGING — CT CT CERVICAL SPINE W/O CM
2 series · 10 of 14 positions shown, 12 images · non-contrast
Comparison: None.

CLINICAL DATA: Cervical radiculopathy.  Left shoulder pain.

EXAM:
CT CERVICAL SPINE WITHOUT CONTRAST
TECHNIQUE: Multidetector CT imaging of the cervical spine was performed without
intravenous contrast. Multiplanar CT image reconstructions were also
generated.

[Series 3: cspine soft · axial · 0.23mm/px · z∈[-175,-55]mm · 5 of 91 slices shown]
[im 16/91  soft-tissue]
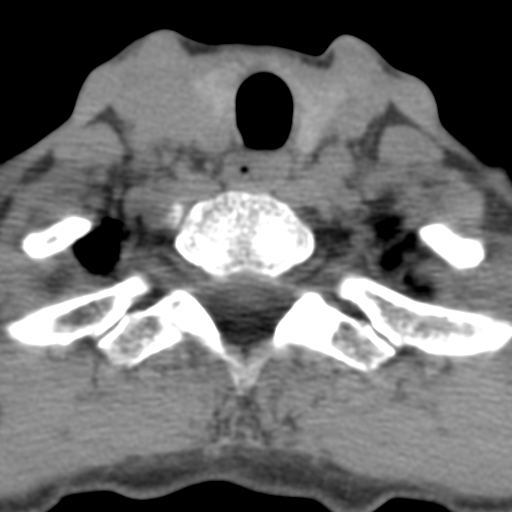
[im 31/91  soft-tissue]
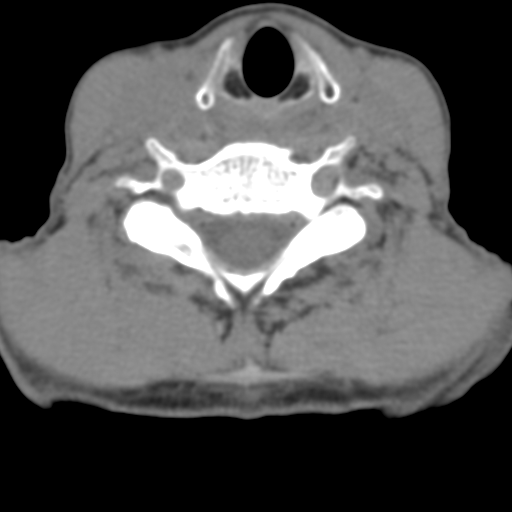
[im 46/91  soft-tissue]
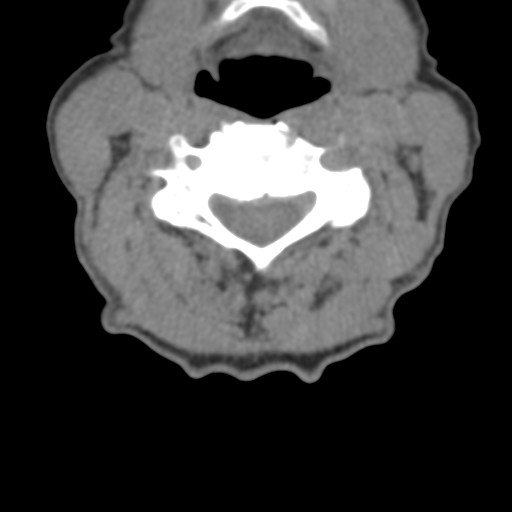
[im 61/91  soft-tissue]
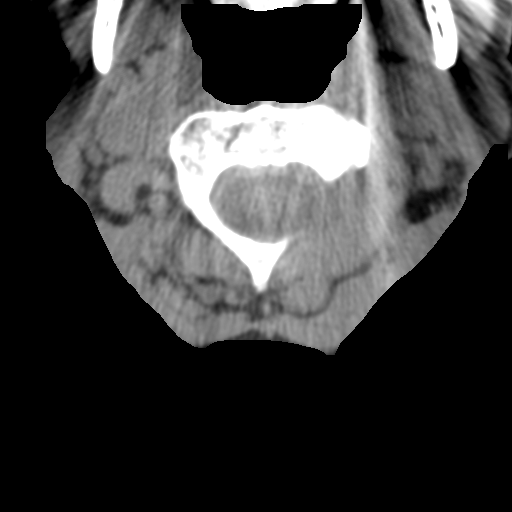
[im 76/91  soft-tissue]
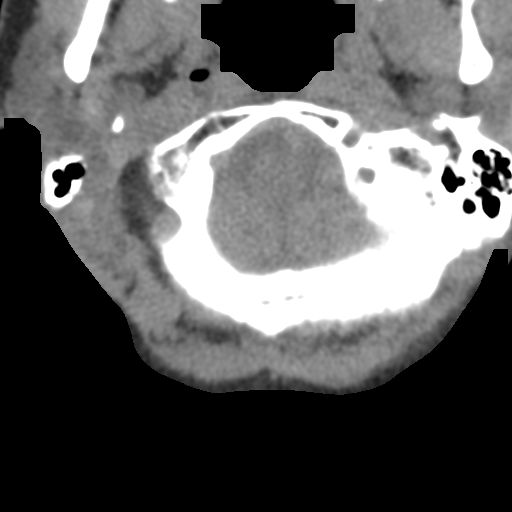

[Series 9: angled axial · axial · 0.23mm/px · z∈[-193,-77]mm · 5 of 91 slices shown, 7 images]
[im 16/91  soft-tissue]
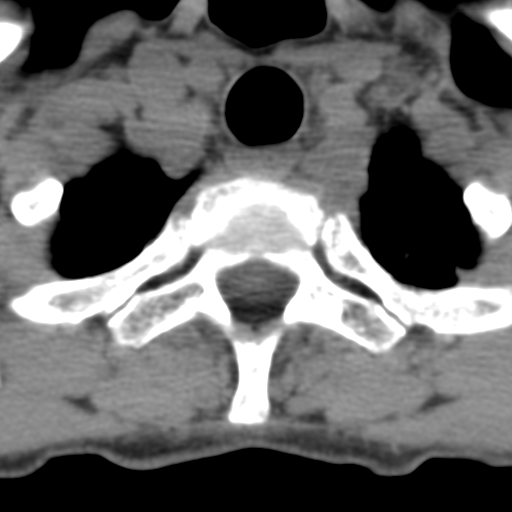
[im 16/91  bone]
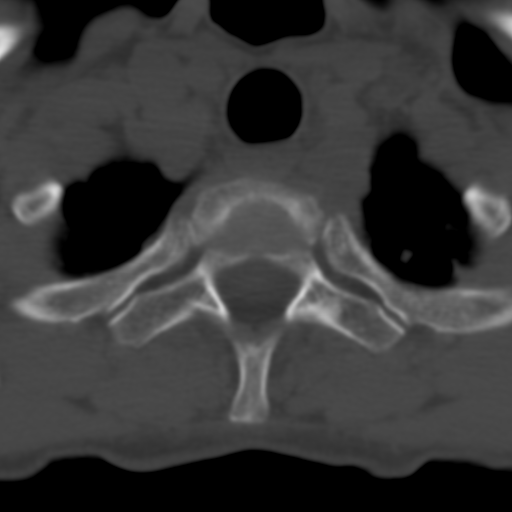
[im 31/91  bone]
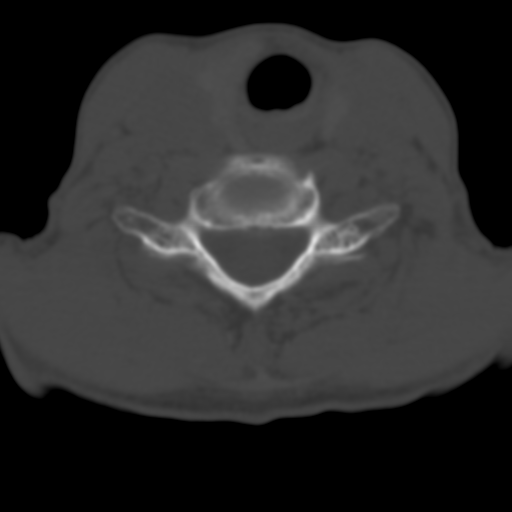
[im 46/91  bone]
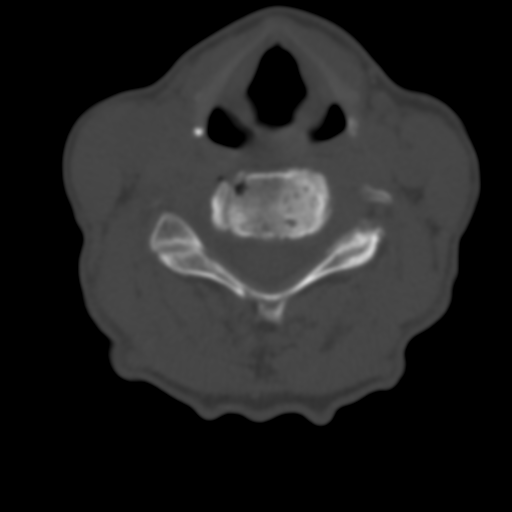
[im 61/91  bone]
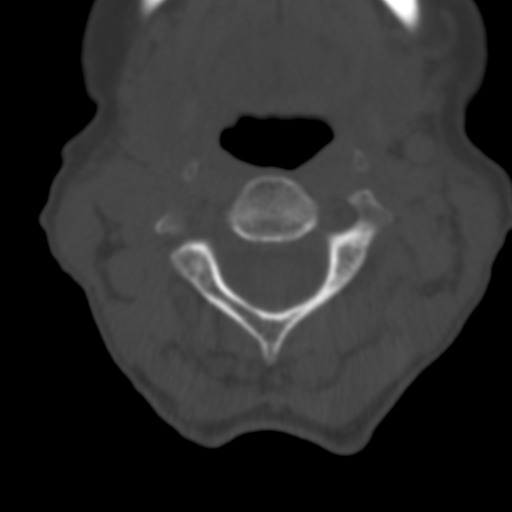
[im 76/91  soft-tissue]
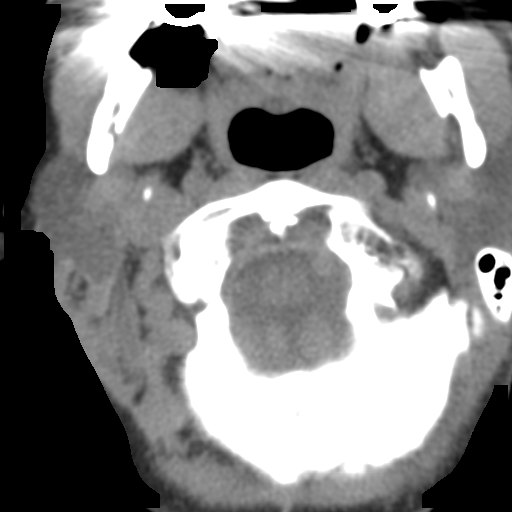
[im 76/91  bone]
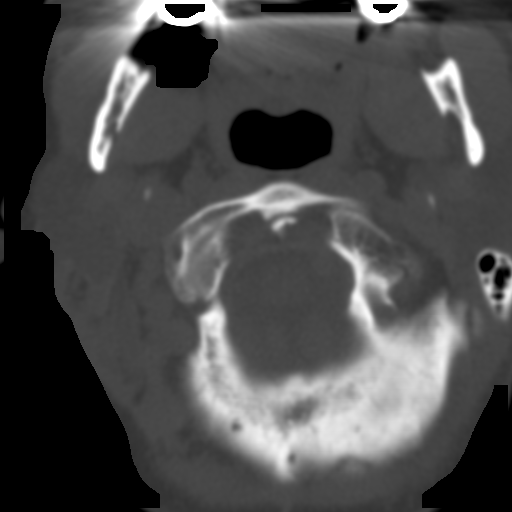

[10 of 14 positions shown; findings below may reference images not displayed]

FINDINGS: Alignment: Normal.

Skull base and vertebrae: No acute fracture. No primary bone lesion
or focal pathologic process.

Soft tissues and spinal canal: No visible prevertebral mass or
swelling.

Disc levels:

C2-3: Mild left facet spurring.  No visible impingement

C3-4: Degenerative disc narrowing that is advanced. Right more than
left endplate and uncovertebral ridging. Canal and foramina remain
patent. Spinal stenosis is mild.

C4-5: Degenerative disc narrowing with asymmetric but small left
uncovertebral spur. Patent canal and foramina

C5-6: Degenerative disc narrowing with small uncovertebral spurs.
Mild, borderline moderate bilateral foraminal narrowing. Mild spinal
stenosis.

C6-7: Shallow central protrusion without visible cord mass effect.
There is disc narrowing without notable ridging. Patent foramina

C7-T1:Mild left facet spurring. No visible herniation or
impingement.

Upper chest: Negative
IMPRESSION: 1. Disc degeneration and mild facet spurring as described above. No
high-grade stenosis to clearly explain left-sided symptoms.
2. History of breast cancer.  No visible spinal metastasis.

## 2019-05-25 ENCOUNTER — Ambulatory Visit: Payer: Federal, State, Local not specified - PPO | Attending: Orthopedic Surgery | Admitting: Physical Therapy

## 2019-05-25 ENCOUNTER — Other Ambulatory Visit: Payer: Self-pay

## 2019-05-25 ENCOUNTER — Encounter: Payer: Self-pay | Admitting: Physical Therapy

## 2019-05-25 DIAGNOSIS — M6281 Muscle weakness (generalized): Secondary | ICD-10-CM | POA: Insufficient documentation

## 2019-05-25 DIAGNOSIS — M25571 Pain in right ankle and joints of right foot: Secondary | ICD-10-CM | POA: Diagnosis not present

## 2019-05-25 NOTE — Patient Instructions (Signed)

## 2019-05-25 NOTE — Therapy (Signed)
[facility] [address], [place], [place], [address] Phone: [phone]   Fax:  [phone]  Physical Therapy Evaluation  Patient Details  Name: Regina Vincent MRN: [id number] Date of Birth: [birth year] Referring Provider (PT): Teresita Madura PA   Encounter Date: 05/25/2019  PT End of Session - 05/25/19 1855    Visit Number  1    Date for PT Re-Evaluation  07/20/19    Authorization Type  [employer] 50 visit    PT Start Time  1230    PT Stop Time  [id number]    PT Time Calculation (min)  47 min    Activity Tolerance  Patient tolerated treatment well       Past Medical History:  Diagnosis Date  . Breast cancer [facility])     Past Surgical History:  Procedure Laterality Date  . BREAST LUMPECTOMY    . BREAST LUMPECTOMY WITH AXILLARY LYMPH NODE BIOPSY Right   . cataract surgery   2015    There were no vitals filed for this visit.   Subjective Assessment - 05/25/19 1235    Subjective  Pain with running.  Diagnosed with posterior tibialis tendonitis.  Improving daily.  In April, started doing a step stretch.  In June, right foot started hurting in arch with running uphill.  Started doing the stretch more often.  Videoed in March midfoot/forefoot runner.  Medial foot/arch.   Normal 8 miles.  Now 5 miles > then that pain produced.  Has not returned to the gym.  Does outside high intensity training 2x/week.  Able to ride bike 30-50 miles without pain.    Pertinent History  healthy;  allergic to NSAIDS    Limitations  Other (comment)    How long can you sit comfortably?  no problem    How long can you stand comfortably?  no problem    How long can you walk comfortably?  no pain with walking    Diagnostic tests  MRI showed posterior tibialis tendonitis    Patient Stated Goals  I want to be able to increase my running mileage;  it's important to me to continue running    Currently in Pain?  No/denies    Pain Score  0-No pain     Pain Location  Ankle    Pain Orientation  Right    Pain Type  Acute pain    Aggravating Factors   running > 5 miles    Pain Relieving Factors  stopped calf stretching         [facility] PT Assessment - 05/25/19 0001      Assessment   Medical Diagnosis  right ankle pain     Referring Provider (PT)  Teresita Madura PA    Onset Date/Surgical Date  --   April    Next MD Visit  as needed     Prior Therapy  no      Precautions   Precautions  None      Restrictions   Weight Bearing Restrictions  No      Balance Screen   Has the patient fallen in the past 6 months  No    Has the patient had a decrease in activity level because of a fear of falling?   No    Is the patient reluctant to leave their home because of a fear of falling?   No      Home Film/video editor residence  Prior Function   Level of Independence  Independent    Vocation  Retired    Leisure  running, cycling      Observation/Other Assessments   Focus on Therapeutic Outcomes (FOTO)   18% limitation       Posture/Postural Control   Posture Comments  no swelling at present; bil pes planus right > left    mild to moderate right collapse inward      AROM   Overall AROM Comments  normal ankle foot and toe ROM     Right Ankle Dorsiflexion  20    Right Ankle Plantar Flexion  60    Right Ankle Inversion  45    Right Ankle Eversion  45      Strength   Overall Strength Comments  decreased stability with SLS with pelvic drop which may indicate some glute medius weakness;  difficulty with 2 single leg heel raises with difficulty and mild pain;  decreased foot intrinsics 4/5;  unable to abduct great toe     Right Ankle Dorsiflexion  5/5    Right Ankle Plantar Flexion  4/5    Right Ankle Inversion  4/5    Right Ankle Eversion  5/5      Palpation   Palpation comment  tender point medial arch, medial forefoot; quadratus plantae; not tender over posterior tib muscle                  Objective measurements completed on examination: See above findings.      [facility] Adult PT Treatment/Exercise - 05/25/19 0001      Iontophoresis   Type of Iontophoresis  Dexamethasone    Location  distal posterior tibialis medial  arch     Dose  4 mg/ml patch    Time  pt concerned about sensitivity so recommended 2 hour wear time instead of usual 4-6 hours             PT Education - 05/25/19 1849    Education Details  discussed ice/elevation after a run;  discussed running shoes with arch support. ionto info;  towel scrunches for intrinsic strengthening    Person(s) Educated  Patient    Methods  Explanation;Demonstration;Handout    Comprehension  Returned demonstration;Verbalized understanding       PT Short Term Goals - 05/25/19 1937      PT SHORT TERM GOAL #1   Title  The patient will understand necessary activity modification (cycling more than running) , self care and appropriate initial HEP    Time  4    Period  Weeks    Status  New    Target Date  06/22/19      PT SHORT TERM GOAL #2   Title  The patient will report a 25% improvement in ankle/foot pain with running    Time  4    Period  Weeks    Status  New        PT Long Term Goals - 05/25/19 1941      PT LONG TERM GOAL #1   Title  The patient will be independent with safe self progression of HEP    Time  8    Period  Weeks    Status  New    Target Date  07/20/19      PT LONG TERM GOAL #2   Title  The patient will report a 50% improvement in ankle/foot pain with running    Time  8  Period  Weeks    Status  New      PT LONG TERM GOAL #3   Title  The patient will have 4+/5 to 5-/5 ankle/foot strength as well as hip strength for better foot alignment with running    Time  8    Period  Weeks    Status  New      PT LONG TERM GOAL #4   Title  FOTO functional outcome score improved to 14% limitation    Time  8    Period  Weeks    Status  New             Plan -  05/25/19 1858    Clinical Impression Statement  The patient is a 73 year old avid long -distance runner who complains of the onset of medial ankle/foot pain for several months with only while running.  She had an MRI which showed posterior tibialis tendonitis.  She was told that the problem would only worsen but she hopes to be able to continue running.  She is able to do 5 mile runs before the onset of pain but normally she runs 8 miles and longer.  She reports the problem was aggravated after she began to do calf stretches very often on a step and since she stopped doing this 2 weeks ago, she is signficantly better.  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  No swelling at present but patient notes some swelling previously.  Ankle/foot ROM and joint mobility WNLs.  Decreased foot intrinsic strength 4/5, ankle plantarflexion strength 4/5, ankle inversion 4/5.  Decreased balance and pelvic drop which may indicate some glute medius weakness.  Difficulty with single leg standing, difficulty with single leg heel raises.    Personal Factors and Comorbidities  Age    Examination-Activity Limitations  Locomotion Level    Examination-Participation Restrictions  Other    Stability/Clinical Decision Making  Stable/Uncomplicated    Clinical Decision Making  Low    Rehab Potential  Good    PT Frequency  2x / week    PT Duration  8 weeks    PT Treatment/Interventions  Moist Heat;Iontophoresis 4mg /ml Dexamethasone;ADLs/Self Care Home Management;Ultrasound;Neuromuscular re-education;Therapeutic exercise;Therapeutic activities;Patient/family education;Manual techniques;Taping;Vasopneumatic Device;Dry needling    PT Next Visit Plan  assess response to ionto and continute trial;  Graston instrument assisted soft tissue/myofascial work to arch, posterior tibialis,  gastroc/soleus;  add to HEP strengthening of glute medius, heel raises from floor level, add ankle inversion band isometric holds,  band ankle dorsiflexion with toe curls, foot intrinsic strengthening to support arch;  pt tends to overstretch/overuse so recommend holding on soleus/gastroc stretching ex; discuss off the shelf orthotic  supports for running shoes;  check single leg squat to see if knee valgus/ gluteal weakness    Consulted and Agree with Plan of Care  Patient       Patient will benefit from skilled therapeutic intervention in order to improve the following deficits and impairments:  Decreased strength, Increased fascial restricitons, Pain  Visit Diagnosis: Pain in right ankle and joints of right foot - Plan: PT plan of care cert/re-cert  Muscle weakness (generalized) - Plan: PT plan of care cert/re-cert     Problem List Patient Active Problem List   Diagnosis Date Noted  . Osteopenia 08/25/2016  . Breast cancer of upper-outer quadrant of right female breast ([place]) 01/24/2016   Ruben Im, PT 05/25/19 8:00 PM Phone: [phone] Fax: [phone] Alvera Singh 05/25/2019,  7:58 PM  [facility] [address] [place], [place], [address] Phone: [phone]   Fax:  [phone]  Name: Avleen Atwal MRN: [id number] Date of Birth: 19-Jul-1946

## 2019-05-31 ENCOUNTER — Ambulatory Visit: Payer: Federal, State, Local not specified - PPO

## 2019-06-06 ENCOUNTER — Ambulatory Visit: Payer: Federal, State, Local not specified - PPO | Admitting: Physical Therapy

## 2019-06-06 ENCOUNTER — Other Ambulatory Visit: Payer: Self-pay

## 2019-06-06 ENCOUNTER — Telehealth: Payer: Self-pay | Admitting: Hematology

## 2019-06-06 DIAGNOSIS — M6281 Muscle weakness (generalized): Secondary | ICD-10-CM

## 2019-06-06 DIAGNOSIS — M25571 Pain in right ankle and joints of right foot: Secondary | ICD-10-CM

## 2019-06-06 NOTE — Therapy (Signed)
[facility] [address] [place], [place], [address] Phone: [phone]   Fax:  [phone]  Physical Therapy Treatment  Patient Details  Name: Regina Vincent MRN: [id number] Date of Birth: 1946-01-22 Referring Provider (PT): Teresita Madura PA   Encounter Date: 06/06/2019  PT End of Session - 06/06/19 1731    Visit Number  2    Date for PT Re-Evaluation  07/20/19    Authorization Type  BCBS 50 visit    PT Start Time  1230    PT Stop Time  1320    PT Time Calculation (min)  50 min    Activity Tolerance  Patient tolerated treatment well       Past Medical History:  Diagnosis Date  . Breast cancer [facility])     Past Surgical History:  Procedure Laterality Date  . BREAST LUMPECTOMY    . BREAST LUMPECTOMY WITH AXILLARY LYMPH NODE BIOPSY Right   . cataract surgery   2015    There were no vitals filed for this visit.  Subjective Assessment - 06/06/19 1232    Subjective  I'm feeling a lot better.  Had a massage 1 week ago  and that really helped.   I'm convinced it's tendonitis and not PTTD.  No change with ionto.   Running 5 miles/day instead of just every other day.    Pertinent History  healthy;  allergic to NSAIDS    Patient Stated Goals  I want to be able to increase my running mileage;  it's important to me to continue running    Currently in Pain?  No/denies    Pain Score  0-No pain    Pain Orientation  Right                       [facility] Adult PT Treatment/Exercise - 06/06/19 0001      Exercises   Other Exercises   review of towel scrunches; continue hold on ankle foot stretching;  review of ex's [place]"  encouraged SLS type ex       Moist Heat Therapy   Number Minutes Moist Heat  5 Minutes    Moist Heat Location  Ankle      Manual Therapy   Soft tissue mobilization  medial and lateral gastroc     Myofascial Release  Graston instrument assisted MFS [id number] instruments to  gastroc, posterior tib, plantar fascia       Ankle Exercises: Standing   Heel Raises  Right;Left;5 reps    Heel Raises Limitations  able to do 5 reps on right but decreased balance     Other Standing Ankle Exercises  single leg squats on right with knee valgus on right however patient status this is a congenital hip problem and not gluteal medius weakness        Trigger Point Dry Needling - 06/06/19 0001    Consent Given?  Yes    Education Handout Provided  Yes    Muscles Treated Lower Quadrant  Gastrocnemius    Other Dry Needling  right     Gastrocnemius Response  Twitch response elicited;Palpable increased muscle length           PT Education - 06/06/19 1731    Education Details  dry needling after care    Person(s) Educated  Patient    Methods  Explanation;Handout    Comprehension  Verbalized understanding       PT Short Term Goals -  05/25/19 1937      PT SHORT TERM GOAL #1   Title  The patient will understand necessary activity modification (cycling more than running) , self care and appropriate initial HEP    Time  4    Period  Weeks    Status  New    Target Date  06/22/19      PT SHORT TERM GOAL #2   Title  The patient will report a 25% improvement in ankle/foot pain with running    Time  4    Period  Weeks    Status  New        PT Long Term Goals - 05/25/19 1941      PT LONG TERM GOAL #1   Title  The patient will be independent with safe self progression of HEP    Time  8    Period  Weeks    Status  New    Target Date  07/20/19      PT LONG TERM GOAL #2   Title  The patient will report a 50% improvement in ankle/foot pain with running    Time  8    Period  Weeks    Status  New      PT LONG TERM GOAL #3   Title  The patient will have 4+/5 to 5-/5 ankle/foot strength as well as hip strength for better foot alignment with running    Time  8    Period  Weeks    Status  New      PT LONG TERM GOAL #4   Title  FOTO functional outcome score  improved to 14% limitation    Time  8    Period  Weeks    Status  New            Plan - 06/06/19 1732    Clinical Impression Statement  The patient reports she is much better since initial evaluation which she attributes to a focused massage on her right calf.   She continues to be compliant with eliminating aggressive calf stretching and that has also decreased her pain.  She has increased her running to 5 miles daily without exacerbation in addition to cycling and a boot camp where she has been working on single leg standing ex's.  She requests Graston instrument assisted myofascial release as recommended by her doctor.  Multiple restrictions and tender points noted in medial gastroc in particular.  Improved soft tissue extensibility noted following however several tender points in medial gastroc persist. Patient receptive to trying dry needling.  Multiple twitch responses produced which is a good prognostic indicator.  Therapist closely monitoring response with all treatment interventions.    Rehab Potential  Good    PT Frequency  2x / week    PT Duration  8 weeks    PT Treatment/Interventions  Moist Heat;Iontophoresis 4mg /ml Dexamethasone;ADLs/Self Care Home Management;Ultrasound;Neuromuscular re-education;Therapeutic exercise;Therapeutic activities;Patient/family education;Manual techniques;Taping;Vasopneumatic Device;Dry needling    PT Next Visit Plan  assess response to DN #1 of medial gastroc;   soft tissue/myofascial work to arch, posterior tibialis,  gastroc/soleus;  may add to  HEP  heel raises from floor level, add ankle inversion band isometric holds, band ankle dorsiflexion with toe curls, foot intrinsic strengthening to support arch;  pt tends to overstretch/overuse so recommend holding on soleus/gastroc stretching ex       Patient will benefit from skilled therapeutic intervention in order to improve the following deficits and impairments:  Decreased strength,  Increased fascial  restricitons, Pain  Visit Diagnosis: Pain in right ankle and joints of right foot  Muscle weakness (generalized)     Problem List Patient Active Problem List   Diagnosis Date Noted  . Osteopenia 08/25/2016  . Breast cancer of upper-outer quadrant of right female breast ([place]) 01/24/2016   Ruben Im, PT 06/06/19 5:44 PM Phone: [phone] Fax: [phone] Alvera Singh 06/06/2019, 5:44 PM  [facility] [address] [place], [place], [address] Phone: [phone]   Fax:  [phone]  Name: Regina Vincent MRN: [id number] Date of Birth: May 02, 1946

## 2019-06-06 NOTE — Telephone Encounter (Signed)
YF PAL 10/23 moved appointment to 11/3. Left message.

## 2019-06-06 NOTE — Patient Instructions (Signed)
     Trigger Point Dry Needling  . What is Trigger Point Dry Needling (DN)? o DN is a physical therapy technique used to treat muscle pain and dysfunction. Specifically, DN helps deactivate muscle trigger points (muscle knots).  o A thin filiform needle is used to penetrate the skin and stimulate the underlying trigger point. The goal is for a local twitch response (LTR) to occur and for the trigger point to relax. No medication of any kind is injected during the procedure.   . What Does Trigger Point Dry Needling Feel Like?  o The procedure feels different for each individual patient. Some patients report that they do not actually feel the needle enter the skin and overall the process is not painful. Very mild bleeding may occur. However, many patients feel a deep cramping in the muscle in which the needle was inserted. This is the local twitch response.   . How Will I feel after the treatment? o Soreness is normal, and the onset of soreness may not occur for a few hours. Typically this soreness does not last longer than two days.  o Bruising is uncommon, however; ice can be used to decrease any possible bruising.  o In rare cases feeling tired or nauseous after the treatment is normal. In addition, your symptoms may get worse before they get better, this period will typically not last longer than 24 hours.   . What Can I do After My Treatment? o Increase your hydration by drinking more water for the next 24 hours. o You may place ice or heat on the areas treated that have become sore, however, do not use heat on inflamed or bruised areas. Heat often brings more relief post needling. o You can continue your regular activities, but vigorous activity is not recommended initially after the treatment for 24 hours. o DN is best combined with other physical therapy such as strengthening, stretching, and other therapies.    Brittiny Levitz PT [facility] [address], [address] [place], [place] [address] Phone # [phone] Fax [phone] 

## 2019-06-08 ENCOUNTER — Ambulatory Visit: Payer: Federal, State, Local not specified - PPO | Admitting: Physical Therapy

## 2019-06-08 ENCOUNTER — Telehealth: Payer: Self-pay | Admitting: Hematology

## 2019-06-08 NOTE — Telephone Encounter (Signed)
Returned patient's phone call regarding rescheduling an appointment, left a voicemail. 

## 2019-06-08 NOTE — Telephone Encounter (Signed)
Left reminder message re 10/23 appointment being moved to 11/3. Schedule mailed.

## 2019-06-12 ENCOUNTER — Ambulatory Visit: Payer: Federal, State, Local not specified - PPO | Admitting: Hematology

## 2019-06-12 ENCOUNTER — Other Ambulatory Visit: Payer: Federal, State, Local not specified - PPO

## 2019-06-13 ENCOUNTER — Encounter: Payer: Self-pay | Admitting: Physical Therapy

## 2019-06-13 ENCOUNTER — Ambulatory Visit: Payer: Federal, State, Local not specified - PPO | Admitting: Physical Therapy

## 2019-06-13 ENCOUNTER — Other Ambulatory Visit: Payer: Self-pay

## 2019-06-13 DIAGNOSIS — M6281 Muscle weakness (generalized): Secondary | ICD-10-CM

## 2019-06-13 DIAGNOSIS — M25571 Pain in right ankle and joints of right foot: Secondary | ICD-10-CM | POA: Diagnosis not present

## 2019-06-13 NOTE — Patient Instructions (Signed)
Access Code: [email or web address]  URL: [email or web address]  Date: 06/13/2019  Prepared by: Ruben Im   Exercises  Isometric Ankle Inversion at Wall - 10 reps - 1 sets - 5 hold - 1x daily - 7x weekly  Seated Ankle Inversion with Anchored Resistance - 10 reps - 1 sets - 5 hold - 1x daily - 7x weekly  Long Sitting Ankle Dorsiflexion with Anchored Resistance - 10 reps - 1 sets - 5 hold - 1x daily - 7x weekly

## 2019-06-13 NOTE — Therapy (Signed)
[facility] [address], [place], [place], [address] Phone: [phone]   Fax:  [phone]  Physical Therapy Treatment  Patient Details  Name: Regina Vincent MRN: [id number] Date of Birth: 1945-08-21 Referring Provider (PT): Teresita Madura PA   Encounter Date: 06/13/2019  PT End of Session - 06/13/19 1538    Visit Number  3    Date for PT Re-Evaluation  07/20/19    Authorization Type  BCBS 50 visit    PT Start Time  [id number]    PT Stop Time  1450    PT Time Calculation (min)  46 min    Activity Tolerance  Patient tolerated treatment well       Past Medical History:  Diagnosis Date  . Breast cancer [facility])     Past Surgical History:  Procedure Laterality Date  . BREAST LUMPECTOMY    . BREAST LUMPECTOMY WITH AXILLARY LYMPH NODE BIOPSY Right   . cataract surgery   2015    There were no vitals filed for this visit.  Subjective Assessment - 06/13/19 1403    Subjective  I ran 7 miles Saturday without pain.  I was really sore after last treatment session.  I ran 5 miles yesterday and 4 miles today.  I feel like I'm making progress.  I'm taking a Zoom ex class for stretching.    Pertinent History  healthy;  allergic to NSAIDS    Currently in Pain?  No/denies    Pain Score  0-No pain    Pain Location  Foot    Pain Orientation  Right    Pain Type  Acute pain                       [facility] Adult PT Treatment/Exercise - 06/13/19 0001      Self-Care   Self-Care  Other Self-Care Comments    Other Self-Care Comments   discussion of orthotics       Exercises   Other Exercises   discussion of her zoom class ex program:  limit ankle eversion reps, keep inv isometrically       Moist Heat Therapy   Number Minutes Moist Heat  5 Minutes    Moist Heat Location  Ankle      Manual Therapy   Soft tissue mobilization  medial and lateral gastroc     Myofascial Release  Graston instrument assisted MFS [id number]  instruments to gastroc, posterior tib, plantar fascia       Ankle Exercises: Seated   Other Seated Ankle Exercises  green band inversion isometric 5 sec hold 10x     Other Seated Ankle Exercises  green band ankle dorsiflexion with toe curl 10x        Trigger Point Dry Needling - 06/13/19 0001    Consent Given?  Yes    Other Dry Needling  right     Gastrocnemius Response  Twitch response elicited;Palpable increased muscle length           PT Education - 06/13/19 1538    Education Details  Access Code: [email or web address]  green band inversion isometric and ankle DF with toe curl    Person(s) Educated  Patient    Methods  Explanation;Demonstration;Handout    Comprehension  Returned demonstration;Verbalized understanding       PT Short Term Goals - 05/25/19 1937      PT SHORT TERM GOAL #1   Title  The patient will understand necessary  activity modification (cycling more than running) , self care and appropriate initial HEP    Time  4    Period  Weeks    Status  New    Target Date  06/22/19      PT SHORT TERM GOAL #2   Title  The patient will report a 25% improvement in ankle/foot pain with running    Time  4    Period  Weeks    Status  New        PT Long Term Goals - 05/25/19 1941      PT LONG TERM GOAL #1   Title  The patient will be independent with safe self progression of HEP    Time  8    Period  Weeks    Status  New    Target Date  07/20/19      PT LONG TERM GOAL #2   Title  The patient will report a 50% improvement in ankle/foot pain with running    Time  8    Period  Weeks    Status  New      PT LONG TERM GOAL #3   Title  The patient will have 4+/5 to 5-/5 ankle/foot strength as well as hip strength for better foot alignment with running    Time  8    Period  Weeks    Status  New      PT LONG TERM GOAL #4   Title  FOTO functional outcome score improved to 14% limitation    Time  8    Period  Weeks    Status  New            Plan - 06/13/19 1539     Clinical Impression Statement  The patient reports a steady improvement in pain reduction with running.  Progression of rehab to include isometric inversion strengthening as well as resisted ankle dorsiflexion with toe flexion without exacerbation of symptoms.  Decreased myofascial tender points noted overall with Graston instrument assisted and dry needling and improved muscle lengths.  Therapist closely monitoring response and providing extensive education on precautions/proper dosage of ex to avoid exacerbation of this problem.    Rehab Potential  Good    PT Frequency  2x / week    PT Duration  8 weeks    PT Treatment/Interventions  Moist Heat;Iontophoresis 4mg /ml Dexamethasone;ADLs/Self Care Home Management;Ultrasound;Neuromuscular re-education;Therapeutic exercise;Therapeutic activities;Patient/family education;Manual techniques;Taping;Vasopneumatic Device;Dry needling    PT Next Visit Plan  check STGs next visit;  continue DN of medial gastroc;   soft tissue/myofascial work to arch, posterior tibialis per Graston,  gastroc/soleus;  may add to  HEP  heel raises from floor level, ankle inversion band isometric holds, band ankle dorsiflexion with toe curls, foot intrinsic strengthening to support arch;  pt tends to overstretch/overuse so recommend holding on soleus/gastroc stretching ex       Patient will benefit from skilled therapeutic intervention in order to improve the following deficits and impairments:  Decreased strength, Increased fascial restricitons, Pain  Visit Diagnosis: Pain in right ankle and joints of right foot  Muscle weakness (generalized)     Problem List Patient Active Problem List   Diagnosis Date Noted  . Osteopenia 08/25/2016  . Breast cancer of upper-outer quadrant of right female breast ([place]) 01/24/2016   Ruben Im, PT 06/13/19 3:46 PM Phone: [phone] Fax: [phone] Alvera Singh 06/13/2019, 3:45 PM  [facility] [address]  [place], [place], [address] Phone: [phone]   Fax:  [phone]  Name: Regina Vincent MRN: [id number] Date of Birth: Jul 26, 1946

## 2019-06-15 ENCOUNTER — Ambulatory Visit: Payer: Federal, State, Local not specified - PPO | Admitting: Physical Therapy

## 2019-06-16 ENCOUNTER — Encounter: Payer: Federal, State, Local not specified - PPO | Admitting: Physical Therapy

## 2019-06-19 ENCOUNTER — Encounter: Payer: Federal, State, Local not specified - PPO | Admitting: Physical Therapy

## 2019-06-20 ENCOUNTER — Other Ambulatory Visit: Payer: Federal, State, Local not specified - PPO

## 2019-06-20 ENCOUNTER — Ambulatory Visit: Payer: Federal, State, Local not specified - PPO | Admitting: Physical Therapy

## 2019-06-20 ENCOUNTER — Ambulatory Visit: Payer: Federal, State, Local not specified - PPO | Admitting: Hematology

## 2019-06-21 NOTE — Progress Notes (Signed)
[facility]   [phone] [phone]   Clinic Follow up Note   Patient Care Team: Dewey, Elizabeth R, MD as PCP - General (Family Medicine) Marcom, Paul Kelly, MD as Referring Physician (Hematology and Oncology) Hwang, Eun-Sil Shelley, MD as Referring Physician (Surgical Oncology) Suneja, Gita, MD as Referring Physician (Radiation Oncology) Causey, Lindsey Cornetto, NP as Nurse Practitioner (Hematology and Oncology)  Date of Service:  06/22/2019  CHIEF COMPLAINT: Follow up right breast cancer  SUMMARY OF ONCOLOGIC HISTORY: Oncology History Overview Note  Cancer Staging Breast cancer of upper-outer quadrant of right female breast (HCC) Staging form: Breast, AJCC 7th Edition - Pathologic stage from 02/13/2017: Stage IA (T1c(m), N0, cM0) - Signed by Feng, Yan, MD on 10/27/2016     Breast cancer of upper-outer quadrant of right female breast (HCC)  12/12/2015 Mammogram   Screening Mammogram 12/12/15 UOQ right breast density.   12/25/2015 Mammogram   Bilateral diagnostic mammogram and US 12/25/15 1) Irregular mass with partially obscured spiculated margins measuring 1.7 cm in the UOQ right breast. 2) USshowed a round hypoechoic mass with indistinct margins in the right breast the 10:00 position 3 cm the nipple which measured 1.1 x 0.9 x 1.1 cm. Additionally, there was a second adjacent mass in the right breast at the 10:00 position 3 cm the nipple which measured 0.5 x 0.6 cm. US of the right axilla was negative.   01/03/2016 Initial Biopsy   Right breast biopsy 01/03/16 Biopsy of the right smaller mass revealed LCIS with no invasive carcinoma identified. Biopsy of the larger right breast mass revealed grade 2 invasive lobular carcinoma.   01/03/2016 Receptors her2   ER 71-80% +, PR 91-100% +, HER2 -   01/23/2016 Breast MRI   Bilateral breast MRI 01/23/16 Adjacent enhancing masses in the right breast at 10:00, consistent with  biopsy-proven ILC and LCIS and  consistent with prior mammograms and ultrasound.The area including the two masses measured approximately 1.6 x 1.3 x 0.9 cm (although the extent of disease on ultrasound measured 3.0 cm).No evidence of additional sites of disease.   01/24/2016 Initial Diagnosis   Breast cancer of upper-outer quadrant of right female breast (HCC)   02/14/2016 Surgery   Right lumpectomy and SLN biopsy 02/14/16 Lumpectomy revealed grade 2 invasive lobular carcinoma with two foci, measuring 1.4 cm and 0.6 cm in a background of lobular carcinoma in situ, negative margins. A biopsied right axillary lymph node was negative. mpT1c, pN0   02/14/2016 Oncotype testing   Oncotype DX score was 16; indicting a recurrence risk of 10% in 5 years with Tamoxifen.   03/24/2016 Imaging   Bone density study 03/24/16  Osteopenia with a T-Score of -2.3 in the AP spine and -0.5 in the femoral neck.    2017 -  Radiation Therapy   The patient was offered radiation therapy, but declined due to her concerns of side effects.    09/11/2016 - 10/08/2016 Anti-estrogen oral therapy   Dr. Corcoran prescribed Tamoxifen 20 mg once a day on 09/11/16. The patient stopped on 10/08/16 due to mood swings (depression), night sweats, difficulty sleeping, broken nails, headaches, right arm pain, light-headedness, chills, fever, fatigue, abdominal bloating, and abdominal pain. The patient reports her symptoms have since resolved, except for fatigue.    02/16/2018 Mammogram   02/16/2018 Mammogram FINDINGS: The breasts are heterogeneously dense, which may obscure small masses.  At the surgical site in the right breast, there is density and  architectural distortion, stable compared to the   prior films and  consistent with a post surgical scar. The lumpectomy site shows no  mammographic evidence of recurrent malignancy. There are no suspicious  masses, calcifications, or other findings in either breast.       CURRENT THERAPY:  Surveillance   INTERVAL  HISTORY:  Regina Vincent is here for a follow up right breast cancer. She was last seen by me in 05/2017. She was seen by NP Lacie 1 year ago interim. She presents to the clinic alone. She note she is doing well. She walks and runs everyday. She notes she recently has tendonitis of her right foot and she is starting to run more again. She denies pain, chest or abdominal issues. She notes her 02/2019 mammogram was normal at [facility]. She notes [facility] was not reasonable with her about setting her mammogram. So she went to [facility] to have it done.    REVIEW OF SYSTEMS:   Constitutional: Denies fevers, chills or abnormal weight loss Eyes: Denies blurriness of vision Ears, nose, mouth, throat, and face: Denies mucositis or sore throat Respiratory: Denies cough, dyspnea or wheezes Cardiovascular: Denies palpitation, chest discomfort or lower extremity swelling Gastrointestinal:  Denies nausea, heartburn or change in bowel habits Skin: Denies abnormal skin rashes MSK: (+) right foot tendonitis  Lymphatics: Denies new lymphadenopathy or easy bruising Neurological:Denies numbness, tingling or new weaknesses Behavioral/Psych: Mood is stable, no new changes  All other systems were reviewed with the patient and are negative.  MEDICAL HISTORY:  Past Medical History:  Diagnosis Date  . Breast cancer (HCC)     SURGICAL HISTORY: Past Surgical History:  Procedure Laterality Date  . BREAST LUMPECTOMY    . BREAST LUMPECTOMY WITH AXILLARY LYMPH NODE BIOPSY Right   . cataract surgery   2015    I have reviewed the social history and family history with the patient and they are unchanged from previous note.  ALLERGIES:  is allergic to naproxen; shellfish-derived products; and ibuprofen.  MEDICATIONS:  Current Outpatient Medications  Medication Sig Dispense Refill  . aspirin 81 MG chewable tablet Chew by mouth.    . TURMERIC CURCUMIN PO Take 400 mg by mouth 2 (two) times daily.     No current  facility-administered medications for this visit.     PHYSICAL EXAMINATION: ECOG PERFORMANCE STATUS: 0 - Asymptomatic  Vitals:   06/22/19 1033  BP: 118/75  Pulse: 64  Resp: 18  Temp: 97.9 F (36.6 C)  SpO2: 100%   Filed Weights   06/22/19 1033  Weight: 107 lb 3.2 oz (48.6 kg)     GENERAL:alert, no distress and comfortable SKIN: skin color, texture, turgor are normal, no rashes or significant lesions EYES: normal, Conjunctiva are pink and non-injected, sclera clear  NECK: supple, thyroid normal size, non-tender, without nodularity LYMPH:  no palpable lymphadenopathy in the cervical, axillary  LUNGS: clear to auscultation and percussion with normal breathing effort HEART: regular rate & rhythm and no murmurs and no lower extremity edema ABDOMEN:abdomen soft, non-tender and normal bowel sounds Musculoskeletal:no cyanosis of digits and no clubbing  NEURO: alert & oriented x 3 with fluent speech, no focal motor/sensory deficits BREAST: S/p right lumpectomy: surgical incision healed well with minimal scar tissue. No palpable mass, nodules or adenopathy bilaterally. Breast exam benign.   LABORATORY DATA:  I have reviewed the data as listed CBC Latest Ref Rng & Units 06/22/2019 06/13/2018 05/24/2017  WBC 4.0 - 10.5 K/uL 7.6 5.7 8.3  Hemoglobin 12.0 - 15.0 g/dL 14.1 14.1 14.3  Hematocrit   36.0 - 46.0 % 40.3 41.4 42.0  Platelets 150 - 400 K/uL 155 193 210     CMP Latest Ref Rng & Units 06/22/2019 06/13/2018 05/24/2017  Glucose 70 - 99 mg/dL 75 100(H) 103  BUN 8 - 23 mg/dL 21 16 19.9  Creatinine 0.44 - 1.00 mg/dL 0.85 0.82 0.9  Sodium 135 - 145 mmol/L 139 136 138  Potassium 3.5 - 5.1 mmol/L 3.8 4.1 4.6  Chloride 98 - 111 mmol/L 102 101 -  CO2 22 - 32 mmol/L 28 28 29  Calcium 8.9 - 10.3 mg/dL 9.5 9.3 10.1  Total Protein 6.5 - 8.1 g/dL 6.7 6.9 7.4  Total Bilirubin 0.3 - 1.2 mg/dL 0.9 1.0 0.79  Alkaline Phos 38 - 126 U/L 68 59 62  AST 15 - 41 U/L 30 33 54(H)  ALT 0 - 44 U/L 40 32  39      RADIOGRAPHIC STUDIES: I have personally reviewed the radiological images as listed and agreed with the findings in the report. No results found.   ASSESSMENT & PLAN:  Regina Vincent is a 73 y.o. female with   1. 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 She is s/p right lumpectomy and SLNB. The Oncotype DX score was 16. This is low risk disease, and does not need adjuvant chemo. The patient was offered radiation therapy, but declined due to her concerns of side effects. She attempted Tamoxifen but stopped after 1 month due to poor tolerance.  -She is clinically doing well. Lab reviewed, her CBC and CMP are within normal limits. Her physical exam normal. Her 02/2019 mammogram at [facility] was unremarkable. There is no clinical concern for recurrence. -Continue surveillance. Next mammogram in 02/2020. I discussed the option of additional screening with breast MRI, she declined.  -F/u in 1 year. F/u with her PCP in interim.   2. Osteopenia -03/24/16 DEXA revealed osteopenia with a T-Score of -2.3 in the AP spine and -0.5 in the femoral neck. -She is on Vitamin D for her VitD deficiency. She previously declined oral calcium.  -I encouraged her to remain very active with walking, running and weight bearing exercise.    PLAN -Lab and f/u in 1 year -Mammogram in 02/2020   No problem-specific Assessment & Plan notes found for this encounter.   No orders of the defined types were placed in this encounter.  All questions were answered. The patient knows to call the clinic with any problems, questions or concerns. No barriers to learning was detected. I spent 15 minutes counseling the patient face to face. The total time spent in the appointment was 20 minutes and more than 50% was on counseling and review of test results     Yan Feng, MD  06/22/2019   I, Amoya Bennett, am acting as scribe for Yan Feng, MD.   I have reviewed the above documentation for accuracy and completeness, and I agree with the above.       

## 2019-06-22 ENCOUNTER — Telehealth: Payer: Self-pay | Admitting: Hematology

## 2019-06-22 ENCOUNTER — Inpatient Hospital Stay: Payer: Federal, State, Local not specified - PPO | Attending: Hematology

## 2019-06-22 ENCOUNTER — Inpatient Hospital Stay (HOSPITAL_BASED_OUTPATIENT_CLINIC_OR_DEPARTMENT_OTHER): Payer: Federal, State, Local not specified - PPO | Admitting: Hematology

## 2019-06-22 ENCOUNTER — Encounter: Payer: Self-pay | Admitting: Hematology

## 2019-06-22 ENCOUNTER — Other Ambulatory Visit: Payer: Self-pay

## 2019-06-22 VITALS — BP 118/75 | HR 64 | Temp 97.9°F | Resp 18 | Ht 62.0 in | Wt 107.2 lb

## 2019-06-22 DIAGNOSIS — Z886 Allergy status to analgesic agent status: Secondary | ICD-10-CM | POA: Insufficient documentation

## 2019-06-22 DIAGNOSIS — C50411 Malignant neoplasm of upper-outer quadrant of right female breast: Secondary | ICD-10-CM | POA: Diagnosis present

## 2019-06-22 DIAGNOSIS — Z170 Estrogen receptor positive status [ER+]: Secondary | ICD-10-CM | POA: Diagnosis not present

## 2019-06-22 DIAGNOSIS — M8580 Other specified disorders of bone density and structure, unspecified site: Secondary | ICD-10-CM | POA: Diagnosis not present

## 2019-06-22 DIAGNOSIS — Z79899 Other long term (current) drug therapy: Secondary | ICD-10-CM | POA: Insufficient documentation

## 2019-06-22 DIAGNOSIS — M8588 Other specified disorders of bone density and structure, other site: Secondary | ICD-10-CM

## 2019-06-22 LAB — COMPREHENSIVE METABOLIC PANEL
ALT: 40 U/L (ref 0–44)
AST: 30 U/L (ref 15–41)
Albumin: 4 g/dL (ref 3.5–5.0)
Alkaline Phosphatase: 68 U/L (ref 38–126)
Anion gap: 9 (ref 5–15)
BUN: 21 mg/dL (ref 8–23)
CO2: 28 mmol/L (ref 22–32)
Calcium: 9.5 mg/dL (ref 8.9–10.3)
Chloride: 102 mmol/L (ref 98–111)
Creatinine, Ser: 0.85 mg/dL (ref 0.44–1.00)
GFR calc Af Amer: >60 mL/min (ref >60)
GFR calc non Af Amer: >60 mL/min (ref >60)
Glucose, Bld: 75 mg/dL (ref 70–99)
Potassium: 3.8 mmol/L (ref 3.5–5.1)
Sodium: 139 mmol/L (ref 135–145)
Total Bilirubin: 0.9 mg/dL (ref 0.3–1.2)
Total Protein: 6.7 g/dL (ref 6.5–8.1)

## 2019-06-22 LAB — CBC WITH DIFFERENTIAL/PLATELET
Abs Immature Granulocytes: 0.01 10*3/uL (ref 0.00–0.07)
Basophils Absolute: 0.1 10*3/uL (ref 0.0–0.1)
Basophils Relative: 1 %
Eosinophils Absolute: 0.1 10*3/uL (ref 0.0–0.5)
Eosinophils Relative: 1 %
HCT: 40.3 % (ref 36.0–46.0)
Hemoglobin: 14.1 g/dL (ref 12.0–15.0)
Immature Granulocytes: 0 %
Lymphocytes Relative: 24 %
Lymphs Abs: 1.8 10*3/uL (ref 0.7–4.0)
MCH: 33 pg (ref 26.0–34.0)
MCHC: 35 g/dL (ref 30.0–36.0)
MCV: 94.4 fL (ref 80.0–100.0)
Monocytes Absolute: 0.4 10*3/uL (ref 0.1–1.0)
Monocytes Relative: 6 %
Neutro Abs: 5.2 10*3/uL (ref 1.7–7.7)
Neutrophils Relative %: 68 %
Platelets: 155 10*3/uL (ref 150–400)
RBC: 4.27 MIL/uL (ref 3.87–5.11)
RDW: 12.2 % (ref 11.5–15.5)
WBC: 7.6 10*3/uL (ref 4.0–10.5)
nRBC: 0 % (ref 0.0–0.2)

## 2019-06-22 NOTE — Telephone Encounter (Signed)
Scheduled appt per 11/5 los. ° °Spoke with pt and she is aware of the appt date and time. °

## 2019-06-23 ENCOUNTER — Ambulatory Visit: Payer: Federal, State, Local not specified - PPO | Attending: Orthopedic Surgery | Admitting: Physical Therapy

## 2019-06-23 ENCOUNTER — Encounter: Payer: Self-pay | Admitting: Physical Therapy

## 2019-06-23 DIAGNOSIS — M6281 Muscle weakness (generalized): Secondary | ICD-10-CM | POA: Insufficient documentation

## 2019-06-23 DIAGNOSIS — M25571 Pain in right ankle and joints of right foot: Secondary | ICD-10-CM | POA: Diagnosis present

## 2019-06-23 NOTE — Therapy (Addendum)
[facility] [address], [place], [place], [address] Phone: [phone]   Fax:  [phone]  Physical Therapy Treatment/Discharge Summary  Patient Details  Name: Regina Vincent MRN: [id number] Date of Birth: 1946-03-19 Referring Provider (PT): Teresita Madura PA   Encounter Date: 06/23/2019  PT End of Session - 06/23/19 1157    Visit Number  4    Date for PT Re-Evaluation  07/20/19    Authorization Type  [employer] 50 visit    PT Start Time  1100    PT Stop Time  1150    PT Time Calculation (min)  50 min    Activity Tolerance  Patient tolerated treatment well       Past Medical History:  Diagnosis Date  . Breast cancer [facility])     Past Surgical History:  Procedure Laterality Date  . BREAST LUMPECTOMY    . BREAST LUMPECTOMY WITH AXILLARY LYMPH NODE BIOPSY Right   . cataract surgery   2015    There were no vitals filed for this visit.  Subjective Assessment - 06/23/19 1102    Subjective  A good week health wise.  Ran every day at least 5 miles.  Going [place] the [place] next week.  End of 5 miles feel medial foot but no anterior ankle pain.  Reports symmetrical mobility.    Pertinent History  healthy;  allergic to NSAIDS    Currently in Pain?  No/denies    Pain Score  0-No pain                       [facility] Adult PT Treatment/Exercise - 06/23/19 0001      Self-Care   Other Self-Care Comments   discussion of HEP, self care strategies, taping effects, how to tape       Modalities   Modalities  Moist Heat      Moist Heat Therapy   Number Minutes Moist Heat  5 Minutes    Moist Heat Location  Ankle      Manual Therapy   Soft tissue mobilization  medial and lateral gastroc, posterior tibialis     Myofascial Release  Graston instrument assisted MFS G234 instruments to gastroc, posterior tib, plantar fascia     Kinesiotex  Facilitate Muscle      Kinesiotix   Facilitate Muscle    posterior tibialis I strip        Trigger Point Dry Needling - 06/23/19 0001    Consent Given?  Yes    Posterior tibialis Response  Palpable increased muscle length    Gastrocnemius Response  Twitch response elicited;Palpable increased muscle length             PT Short Term Goals - 06/23/19 1210      PT SHORT TERM GOAL #1   Title  The patient will understand necessary activity modification (cycling more than running) , self care and appropriate initial HEP    Status  Achieved      PT SHORT TERM GOAL #2   Title  The patient will report a 25% improvement in ankle/foot pain with running    Status  Achieved        PT Long Term Goals - 05/25/19 1941      PT LONG TERM GOAL #1   Title  The patient will be independent with safe self progression of HEP    Time  8    Period  Weeks  Status  New    Target Date  07/20/19      PT LONG TERM GOAL #2   Title  The patient will report a 50% improvement in ankle/foot pain with running    Time  8    Period  Weeks    Status  New      PT LONG TERM GOAL #3   Title  The patient will have 4+/5 to 5-/5 ankle/foot strength as well as hip strength for better foot alignment with running    Time  8    Period  Weeks    Status  New      PT LONG TERM GOAL #4   Title  FOTO functional outcome score improved to 14% limitation    Time  8    Period  Weeks    Status  New            Plan - 06/23/19 1202    Clinical Impression Statement  Pain level much improved despite increased running frequency.  Tender points much improved overall but some myofascial changes still present in medial and lateral gastroc.  Good initial response to KT over posterior tib muscle/tendon.  Therapist closely monitoring response with all treatment interventions.  STGs met.  Decrease treatment frequency and follow up in 2 weeks.    Rehab Potential  Good    PT Frequency  2x / week    PT Duration  8 weeks    PT Treatment/Interventions  Moist Heat;Iontophoresis  76m/ml Dexamethasone;ADLs/Self Care Home Management;Ultrasound;Neuromuscular re-education;Therapeutic exercise;Therapeutic activities;Patient/family education;Manual techniques;Taping;Vasopneumatic Device;Dry needling    PT Next Visit Plan  continue DN of medial gastroc and post tibialis;  assess response to KT;    soft tissue/myofascial work to arch, posterior tibialis per Graston,  gastroc/soleus;  ankle inversion band isometric holds, band ankle dorsiflexion with toe curls, foot intrinsic strengthening to support arch;  pt tends to overstretch/overuse so recommend holding on soleus/gastroc stretching ex       Patient will benefit from skilled therapeutic intervention in order to improve the following deficits and impairments:  Decreased strength, Increased fascial restricitons, Pain  Visit Diagnosis: Pain in right ankle and joints of right foot  Muscle weakness (generalized)    PHYSICAL THERAPY DISCHARGE SUMMARY  Visits from Start of Care: 4  Current functional level related to goals / functional outcomes: The patient called to request discharge from PT since she is feeling much better.  She responded well to Graston, dry needling and other manual therapy.     Remaining deficits: Unable to assess progress toward goals.    Education / Equipment: HEP Plan: Patient agrees to discharge.  Patient goals were partially met. Patient is being discharged due to the patient's request.  ?????      Problem List Patient Active Problem List   Diagnosis Date Noted  . Osteopenia 08/25/2016  . Breast cancer of upper-outer quadrant of right female breast ([place] 01/24/2016   SRuben Im PT 06/23/19 12:11 PM Phone: [phone]: [phone] Singh11/01/2019, 12:11 PM  [facility] [address] [place] [address]Phone: [phone]  Fax:  [phone] Name: Regina VanzileMRN: [id number] of  Birth: 8June 30, 1947

## 2019-07-11 ENCOUNTER — Encounter: Payer: Federal, State, Local not specified - PPO | Admitting: Physical Therapy

## 2019-07-18 ENCOUNTER — Encounter: Payer: Federal, State, Local not specified - PPO | Admitting: Physical Therapy

## 2019-08-19 ENCOUNTER — Encounter: Payer: Self-pay | Admitting: Hematology

## 2019-08-25 ENCOUNTER — Encounter: Payer: Self-pay | Admitting: Hematology

## 2019-09-13 ENCOUNTER — Ambulatory Visit: Payer: Federal, State, Local not specified - PPO

## 2019-09-14 ENCOUNTER — Ambulatory Visit: Payer: Federal, State, Local not specified - PPO

## 2019-09-22 ENCOUNTER — Ambulatory Visit: Payer: Federal, State, Local not specified - PPO

## 2019-09-24 ENCOUNTER — Ambulatory Visit: Payer: Federal, State, Local not specified - PPO

## 2019-09-25 ENCOUNTER — Ambulatory Visit: Payer: Federal, State, Local not specified - PPO

## 2020-03-15 LAB — COLOGUARD: COLOGUARD: POSITIVE — AB

## 2020-06-20 ENCOUNTER — Inpatient Hospital Stay: Payer: Federal, State, Local not specified - PPO | Admitting: Hematology

## 2020-06-20 ENCOUNTER — Inpatient Hospital Stay: Payer: Federal, State, Local not specified - PPO

## 2020-07-18 ENCOUNTER — Telehealth: Payer: Self-pay | Admitting: Hematology

## 2020-07-18 NOTE — Telephone Encounter (Signed)
Called pt per 12/2 sch msg - no answer , left message for patient to call back to reschedule.

## 2020-07-19 ENCOUNTER — Inpatient Hospital Stay: Payer: Federal, State, Local not specified - PPO

## 2020-07-19 ENCOUNTER — Inpatient Hospital Stay: Payer: Federal, State, Local not specified - PPO | Admitting: Hematology

## 2020-07-19 DIAGNOSIS — C50411 Malignant neoplasm of upper-outer quadrant of right female breast: Secondary | ICD-10-CM

## 2020-08-26 NOTE — Progress Notes (Signed)
Regina Vincent   Telephone:(336) [phone] Fax:(336) [phone]   Clinic Follow up Note   Patient Care Team: Fanny Bien, MD as PCP - General (Family Medicine) Juanita Craver, Gerrie Nordmann, MD as Referring Physician (Hematology and Oncology) Sheliah Hatch, MD as Referring Physician (Surgical Oncology) Bonner Puna, MD as Referring Physician (Radiation Oncology) Gardenia Phlegm, NP as Nurse Practitioner (Hematology and Oncology)  Date of Service:  08/27/2020  CHIEF COMPLAINT: Follow up right breast cancer  SUMMARY OF ONCOLOGIC HISTORY: Oncology History Overview Note  Cancer Staging Breast cancer of upper-outer quadrant of right female breast [facility]) Staging form: Breast, AJCC 7th Edition - Pathologic stage from 02/13/2017: Stage IA (T1c(m), N0, cM0) - Signed by Truitt Merle, MD on 10/27/2016     Breast cancer of upper-outer quadrant of right female breast ([place])  12/12/2015 Mammogram   Screening Mammogram 12/12/15 UOQ right breast density.   12/25/2015 Mammogram   Bilateral diagnostic mammogram and [place] 12/25/15 1) Irregular mass with partially obscured spiculated margins measuring 1.7 cm in the UOQ right breast. 2) USshowed a round hypoechoic mass with indistinct margins in the right breast the 10:00 position 3 cm the nipple which measured 1.1 x 0.9 x 1.1 cm. Additionally, there was a second adjacent mass in the right breast at the 10:00 position 3 cm the nipple which measured 0.5 x 0.6 cm. US of the right axilla was negative.   01/03/2016 Initial Biopsy   Right breast biopsy 01/03/16 Biopsy of the right smaller mass revealed LCIS with no invasive carcinoma identified. Biopsy of the larger right breast mass revealed grade 2 invasive lobular carcinoma.   01/03/2016 Receptors her2   ER 71-80% +, PR 91-100% +, HER2 -   01/23/2016 Breast MRI   Bilateral breast MRI 01/23/16 Adjacent enhancing masses in the right breast at 10:00, consistent with  biopsy-proven ILC and LCIS and  consistent with prior mammograms and ultrasound.The area including the two masses measured approximately 1.6 x 1.3 x 0.9 cm (although the extent of disease on ultrasound measured 3.0 cm).No evidence of additional sites of disease.   01/24/2016 Initial Diagnosis   Breast cancer of upper-outer quadrant of right female breast ([place])   02/14/2016 Surgery   Right lumpectomy and SLN biopsy 02/14/16 Lumpectomy revealed grade 2 invasive lobular carcinoma with two foci, measuring 1.4 cm and 0.6 cm in a background of lobular carcinoma in situ, negative margins. A biopsied right axillary lymph node was negative. mpT1c, pN0   02/14/2016 Oncotype testing   Oncotype DX score was 16; indicting a recurrence risk of 10% in 5 years with Tamoxifen.   03/24/2016 Imaging   Bone density study 03/24/16  Osteopenia with a T-Score of -2.3 in the AP spine and -0.5 in the femoral neck.    2017 -  Radiation Therapy   The patient was offered radiation therapy, but declined due to her concerns of side effects.    09/11/2016 - 10/08/2016 Anti-estrogen oral therapy   Dr. Mike Gip prescribed Tamoxifen 20 mg once a day on 09/11/16. The patient stopped on 10/08/16 due to mood swings (depression), night sweats, difficulty sleeping, broken nails, headaches, right arm pain, light-headedness, chills, fever, fatigue, abdominal bloating, and abdominal pain. The patient reports her symptoms have since resolved, except for fatigue.    02/16/2018 Mammogram   02/16/2018 Mammogram FINDINGS: The breasts are heterogeneously dense, which may obscure small masses.  At the surgical site in the right breast, there is density and  architectural distortion, stable compared to the  prior films and  consistent with a post surgical scar. The lumpectomy site shows no  mammographic evidence of recurrent malignancy. There are no suspicious  masses, calcifications, or other findings in either breast.       CURRENT THERAPY:  Surveillance   INTERVAL  HISTORY:  Regina Vincent is here for a follow up of right breast cancer. She was last seen by me in 06/2019. She presents to the clinic alone. She is clinically doing well.  She has good appetite and energy level, weight has been stable.  She is a runner, however due to the [place] pandemic, she has not a being able to participate any running competition. She does exercise regularly at home.   Partner has COPD, has been staying home most of time due to [place] pandemic, she got 3 COVID vaccines.    All other systems were reviewed with the patient and are negative.  MEDICAL HISTORY:  Past Medical History:  Diagnosis Date  . Breast cancer [facility])     SURGICAL HISTORY: Past Surgical History:  Procedure Laterality Date  . BREAST LUMPECTOMY    . BREAST LUMPECTOMY WITH AXILLARY LYMPH NODE BIOPSY Right   . cataract surgery   2015    I have reviewed the social history and family history with the patient and they are unchanged from previous note.  ALLERGIES:  is allergic to naproxen, shellfish-derived products, and ibuprofen.  MEDICATIONS:  Current Outpatient Medications  Medication Sig Dispense Refill  . Multiple Vitamins-Minerals (MULTIVITAMIN WOMENS 50+ ADV PO) Take 1 tablet by mouth daily.    . Omega-3 Fatty Acids (FISH OIL) 600 MG CAPS Take 1 capsule by mouth daily.     No current facility-administered medications for this visit.    PHYSICAL EXAMINATION: ECOG PERFORMANCE STATUS: 0 - Asymptomatic  Vitals:   08/27/20 1159  BP: (!) 143/89  Pulse: 67  Temp: (!) 97.3 F (36.3 C)  SpO2: 100%   Filed Weights   08/27/20 1159  Weight: 106 lb 14.4 oz (48.5 kg)    GENERAL:alert, no distress and comfortable SKIN: skin color, texture, turgor are normal, no rashes or significant lesions EYES: normal, Conjunctiva are pink and non-injected, sclera clear NECK: supple, thyroid normal size, non-tender, without nodularity LYMPH:  no palpable lymphadenopathy in the cervical, axillary  LUNGS:  clear to auscultation and percussion with normal breathing effort HEART: regular rate & rhythm and no murmurs and no lower extremity edema ABDOMEN:abdomen soft, non-tender and normal bowel sounds Musculoskeletal:no cyanosis of digits and no clubbing  NEURO: alert & oriented x 3 with fluent speech, no focal motor/sensory deficits Breasts: Breast inspection showed them to be symmetrical with no nipple discharge.  Surgical scar in the right breast has healed well.  Palpation of the breasts and axilla revealed no obvious mass that I could appreciate.   LABORATORY DATA:  I have reviewed the data as listed CBC Latest Ref Rng & Units 08/27/2020 06/22/2019 06/13/2018  WBC 4.0 - 10.5 K/uL 7.8 7.6 5.7  Hemoglobin 12.0 - 15.0 g/dL 14.5 14.1 14.1  Hematocrit 36.0 - 46.0 % 41.4 40.3 41.4  Platelets 150 - 400 K/uL 209 155 193     CMP Latest Ref Rng & Units 08/27/2020 06/22/2019 06/13/2018  Glucose 70 - 99 mg/dL 80 75 100(H)  BUN 8 - 23 mg/dL 18 21 16   Creatinine 0.44 - 1.00 mg/dL 1.00 0.85 0.82  Sodium 135 - 145 mmol/L 135 139 136  Potassium 3.5 - 5.1 mmol/L 4.4 3.8 4.1  Chloride 98 -  111 mmol/L 101 102 101  CO2 22 - 32 mmol/L 28 28 28   Calcium 8.9 - 10.3 mg/dL 9.3 9.5 9.3  Total Protein 6.5 - 8.1 g/dL 7.0 6.7 6.9  Total Bilirubin 0.3 - 1.2 mg/dL 0.7 0.9 1.0  Alkaline Phos 38 - 126 U/L 58 68 59  AST 15 - 41 U/L 29 30 33  ALT 0 - 44 U/L 25 40 32      RADIOGRAPHIC STUDIES: I have personally reviewed the radiological images as listed and agreed with the findings in the report. No results found.   ASSESSMENT & PLAN:  Regina Vincent is a 75 y.o. female with    1. 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 She is s/p right lumpectomy and SLNB. The Oncotype DX score was 16. This is low risk disease, and does not need adjuvant chemo.  -The patient was  offered radiation therapy, but declined due to her concerns of side effects. She attempted Tamoxifen but stopped after 1 month due to poor tolerance. She is currently on surveillance.  -She is clinically doing well, asymptomatic. Lab reviewed, her CBC and CMP are within normal limits. Her physical exam and her mammogram in 02/2020 at [facility] were unremarkable. There is no clinical concern for recurrence. -Continue breast cancer surveillance.  We again reviewed red flags for breast cancer recurrence, she knows to monitor at home.  She will call us if she has any concerns. -f/u in one year  2. Osteopenia -03/24/16 DEXA revealed osteopenia with a T-Score of -2.3 in the AP spine and -0.5 in the femoral neck. -She is on Vitamin D for her VitD deficiency. She previously declined oral calcium and biphosphonate.  -I encouraged her to remain very active with walking, running and weight bearing exercise.    PLAN -She is clinically doing very well, continue breast cancer surveillance. -Lab and f/u in 1 year -M[facility] in 02/2021 at [facility]    No problem-specific Assessment & Plan notes found for this encounter.   No orders of the defined types were placed in this encounter.  All questions were answered. The patient knows to call the clinic with any problems, questions or concerns. No barriers to learning was detected. The total time spent in the appointment was 25 minutes.     Truitt Merle, MD 08/27/2020   I, Joslyn Devon, am acting as scribe for Truitt Merle, MD.   I have reviewed the above documentation for accuracy and completeness, and I agree with the above.

## 2020-08-27 ENCOUNTER — Encounter: Payer: Self-pay | Admitting: Hematology

## 2020-08-27 ENCOUNTER — Other Ambulatory Visit: Payer: Self-pay

## 2020-08-27 ENCOUNTER — Inpatient Hospital Stay: Payer: Federal, State, Local not specified - PPO | Attending: Hematology

## 2020-08-27 ENCOUNTER — Telehealth: Payer: Self-pay | Admitting: Hematology

## 2020-08-27 ENCOUNTER — Inpatient Hospital Stay: Payer: Federal, State, Local not specified - PPO | Admitting: Hematology

## 2020-08-27 VITALS — BP 143/89 | HR 67 | Temp 97.3°F | Ht 62.0 in | Wt 106.9 lb

## 2020-08-27 DIAGNOSIS — Z79899 Other long term (current) drug therapy: Secondary | ICD-10-CM | POA: Diagnosis not present

## 2020-08-27 DIAGNOSIS — Z170 Estrogen receptor positive status [ER+]: Secondary | ICD-10-CM | POA: Diagnosis not present

## 2020-08-27 DIAGNOSIS — M85859 Other specified disorders of bone density and structure, unspecified thigh: Secondary | ICD-10-CM | POA: Diagnosis not present

## 2020-08-27 DIAGNOSIS — Z886 Allergy status to analgesic agent status: Secondary | ICD-10-CM | POA: Diagnosis not present

## 2020-08-27 DIAGNOSIS — E559 Vitamin D deficiency, unspecified: Secondary | ICD-10-CM | POA: Diagnosis not present

## 2020-08-27 DIAGNOSIS — M8588 Other specified disorders of bone density and structure, other site: Secondary | ICD-10-CM | POA: Diagnosis not present

## 2020-08-27 DIAGNOSIS — C50411 Malignant neoplasm of upper-outer quadrant of right female breast: Secondary | ICD-10-CM | POA: Diagnosis present

## 2020-08-27 LAB — CBC WITH DIFFERENTIAL/PLATELET
Abs Immature Granulocytes: 0.01 10*3/uL (ref 0.00–0.07)
Basophils Absolute: 0.1 10*3/uL (ref 0.0–0.1)
Basophils Relative: 1 %
Eosinophils Absolute: 0 10*3/uL (ref 0.0–0.5)
Eosinophils Relative: 0 %
HCT: 41.4 % (ref 36.0–46.0)
Hemoglobin: 14.5 g/dL (ref 12.0–15.0)
Immature Granulocytes: 0 %
Lymphocytes Relative: 23 %
Lymphs Abs: 1.8 10*3/uL (ref 0.7–4.0)
MCH: 33 pg (ref 26.0–34.0)
MCHC: 35 g/dL (ref 30.0–36.0)
MCV: 94.1 fL (ref 80.0–100.0)
Monocytes Absolute: 0.4 10*3/uL (ref 0.1–1.0)
Monocytes Relative: 6 %
Neutro Abs: 5.5 10*3/uL (ref 1.7–7.7)
Neutrophils Relative %: 70 %
Platelets: 209 10*3/uL (ref 150–400)
RBC: 4.4 MIL/uL (ref 3.87–5.11)
RDW: 12.4 % (ref 11.5–15.5)
WBC: 7.8 10*3/uL (ref 4.0–10.5)
nRBC: 0 % (ref 0.0–0.2)

## 2020-08-27 LAB — COMPREHENSIVE METABOLIC PANEL
ALT: 25 U/L (ref 0–44)
AST: 29 U/L (ref 15–41)
Albumin: 3.9 g/dL (ref 3.5–5.0)
Alkaline Phosphatase: 58 U/L (ref 38–126)
Anion gap: 6 (ref 5–15)
BUN: 18 mg/dL (ref 8–23)
CO2: 28 mmol/L (ref 22–32)
Calcium: 9.3 mg/dL (ref 8.9–10.3)
Chloride: 101 mmol/L (ref 98–111)
Creatinine, Ser: 1 mg/dL (ref 0.44–1.00)
GFR, Estimated: 60 mL/min — ABNORMAL LOW (ref >60)
Glucose, Bld: 80 mg/dL (ref 70–99)
Potassium: 4.4 mmol/L (ref 3.5–5.1)
Sodium: 135 mmol/L (ref 135–145)
Total Bilirubin: 0.7 mg/dL (ref 0.3–1.2)
Total Protein: 7 g/dL (ref 6.5–8.1)

## 2020-08-27 NOTE — Telephone Encounter (Signed)
Scheduled appointments per 1/11 los. Spoke to patient who is aware of appointments date and times.  

## 2021-05-14 ENCOUNTER — Telehealth: Payer: Self-pay

## 2021-05-14 NOTE — Telephone Encounter (Signed)
NOTES ON FILE FROM [facility] FAMILY [phone], SENT REFERRAL TO SCHEDULING

## 2021-06-16 ENCOUNTER — Telehealth: Payer: Self-pay

## 2021-06-16 NOTE — Telephone Encounter (Signed)
Spoke with pt via telephone to let her know that she is scheduled to see Cira Rue, NP tomorrow 06/17/2021 at 0930.  Included Cira Rue, NP on the staff message sent to Dr Burr Medico and this RN from [facility] Pyo in scheduling stating the pt has a new knot on her breast and is requesting to see Dr. Burr Medico.  Informed pt during my call to her that Regan Rakers is aware of the new knot as well as Dr. Burr Medico which they will further discuss in her appt tomorrow with Cira Rue, NP.

## 2021-06-16 NOTE — Progress Notes (Signed)
[place]   Telephone:(336) [phone] Fax:(336) [phone]   Clinic Follow up Note   Patient Care Team: Fanny Bien, MD as PCP - General (Family Medicine) Juanita Craver, Gerrie Nordmann, MD as Referring Physician (Hematology and Oncology) Sheliah Hatch, MD as Referring Physician (Surgical Oncology) Bonner Puna, MD as Referring Physician (Radiation Oncology) Gardenia Phlegm, NP as Nurse Practitioner (Hematology and Oncology) 06/17/2021  CHIEF COMPLAINT: Follow up right breast cancer, new R breast lump  SUMMARY OF ONCOLOGIC HISTORY: Oncology History Overview Note  Cancer Staging Breast cancer of upper-outer quadrant of right female breast [facility]) Staging form: Breast, AJCC 7th Edition - Pathologic stage from 02/13/2017: Stage IA (T1c(m), N0, cM0) - Signed by Truitt Merle, MD on 10/27/2016     Breast cancer of upper-outer quadrant of right female breast ([place])  12/12/2015 Mammogram   Screening Mammogram 12/12/15 UOQ right breast density.   12/25/2015 Mammogram   Bilateral diagnostic mammogram and [place] 12/25/15 1) Irregular mass with partially obscured spiculated margins measuring 1.7 cm in the UOQ right breast. 2) USshowed a round hypoechoic mass with indistinct margins in the right breast the 10:00 position 3 cm the nipple which measured 1.1 x 0.9 x 1.1 cm. Additionally, there was a second adjacent mass in the right breast at the 10:00 position 3 cm the nipple which measured 0.5 x 0.6 cm. US of the right axilla was negative.   01/03/2016 Initial Biopsy   Right breast biopsy 01/03/16 Biopsy of the right smaller mass revealed LCIS with no invasive carcinoma identified. Biopsy of the larger right breast mass revealed grade 2 invasive lobular carcinoma.   01/03/2016 Receptors her2   ER 71-80% +, PR 91-100% +, HER2 -   01/23/2016 Breast MRI   Bilateral breast MRI 01/23/16 Adjacent enhancing masses in the right breast at 10:00, consistent with  biopsy-proven ILC and LCIS and  consistent with prior mammograms and ultrasound.  The area including the two masses measured approximately 1.6 x 1.3 x 0.9 cm (although the extent of disease on ultrasound measured 3.0 cm).  No evidence of additional sites of disease.   01/24/2016 Initial Diagnosis   Breast cancer of upper-outer quadrant of right female breast ([place])   02/14/2016 Surgery   Right lumpectomy and SLN biopsy 02/14/16 Lumpectomy revealed grade 2 invasive lobular carcinoma with two foci, measuring 1.4 cm and 0.6 cm in a background of lobular carcinoma in situ, negative margins. A biopsied right axillary lymph node was negative. mpT1c, pN0   02/14/2016 Oncotype testing   Oncotype DX score was 16; indicting a recurrence risk of 10% in 5 years with Tamoxifen.   03/24/2016 Imaging   Bone density study 03/24/16  Osteopenia with a T-Score of -2.3 in the AP spine and -0.5 in the femoral neck.    2017 -  Radiation Therapy   The patient was offered radiation therapy, but declined due to her concerns of side effects.    09/11/2016 - 10/08/2016 Anti-estrogen oral therapy   Dr. Mike Gip prescribed Tamoxifen 20 mg once a day on 09/11/16. The patient stopped on 10/08/16 due to mood swings (depression), night sweats, difficulty sleeping, broken nails, headaches, right arm pain, light-headedness, chills, fever, fatigue, abdominal bloating, and abdominal pain. The patient reports her symptoms have since resolved, except for fatigue.    02/16/2018 Mammogram   02/16/2018 Mammogram FINDINGS: The breasts are heterogeneously dense, which may obscure small masses.  At the surgical site in the right breast, there is density and  architectural distortion, stable  compared to the prior films and  consistent with a post surgical scar. The lumpectomy site shows no  mammographic evidence of recurrent malignancy. There are no suspicious  masses, calcifications, or other findings in either breast.      CURRENT THERAPY: Surveillance   INTERVAL  HISTORY: Regina Vincent presents for follow up for new right breast lump she palpated over the weekend.  Slightly painful to palpation.  It does appear to change in size throughout the day, smaller in the morning and larger in the evening.  Denies nipple discharge or inversion, or skin change.  Last seen for routine surveillance visit 08/27/20. She had a mammogram 03/24/21 that showed dense breast tissue but negative for malignancy.  She had COVID in mid July, symptoms were cough, exertional dyspnea, dizziness, joint pain (knees, ankles, elbows), and fatigue.  She took Paxil of it.  She developed a subsequent ear infection and completed amoxicillin.  She still has mild residual dizziness, other symptoms have resolved.  She has resumed vigorous exercising running 5-7 miles per day and outdoor [employer] twice per day.  She thinks maybe she is exercising too much too soon.  Energy and appetite are normal, denies unintentional weight loss or other specific concerns.   MEDICAL HISTORY:  Past Medical History:  Diagnosis Date   Breast cancer ([place])     SURGICAL HISTORY: Past Surgical History:  Procedure Laterality Date   BREAST LUMPECTOMY     BREAST LUMPECTOMY WITH AXILLARY LYMPH NODE BIOPSY Right    cataract surgery   2015    I have reviewed the social history and family history with the patient and they are unchanged from previous note.  ALLERGIES:  is allergic to naproxen, shellfish-derived products, and ibuprofen.  MEDICATIONS:  Current Outpatient Medications  Medication Sig Dispense Refill   Multiple Vitamins-Minerals (MULTIVITAMIN WOMENS 50+ ADV PO) Take 1 tablet by mouth daily.     Omega-3 Fatty Acids (FISH OIL) 600 MG CAPS Take 1 capsule by mouth daily.     No current facility-administered medications for this visit.    PHYSICAL EXAMINATION:  Vitals:   06/17/21 0944  BP: (!) 146/81  Pulse: 83  Resp: 17  Temp: 98.3 F (36.8 C)  SpO2: 100%   Filed Weights   06/17/21 0944  Weight: 106  lb 6.4 oz (48.3 kg)    GENERAL:alert, no distress and comfortable SKIN: No rash EYES: sclera clear NECK: Without mass LYMPH:  no palpable cervical or supraclavicular lymphadenopathy LUNGS:  normal breathing effort HEART: no lower extremity edema Musculoskeletal: No focal spinal tenderness NEURO: alert & oriented x 3 with fluent speech, no focal motor/sensory deficits Breast exam: Petite female with symmetrical breasts, no nipple discharge or inversion.  S/p right lumpectomy, incisions completely healed with minimal scar tissue.  Small 0.5-1 cm movable lump 2 fingerbreadths above the areola at 12:00 right breast.  No other palpable mass or nodularity in the left breast or either axilla.  LABORATORY DATA:  I have reviewed the data as listed CBC Latest Ref Rng & Units 08/27/2020 06/22/2019 06/13/2018  WBC 4.0 - 10.5 K/uL 7.8 7.6 5.7  Hemoglobin 12.0 - 15.0 g/dL 14.5 14.1 14.1  Hematocrit 36.0 - 46.0 % 41.4 40.3 41.4  Platelets 150 - 400 K/uL 209 155 193     CMP Latest Ref Rng & Units 08/27/2020 06/22/2019 06/13/2018  Glucose 70 - 99 mg/dL 80 75 100(H)  BUN 8 - 23 mg/dL 18 21 16   Creatinine 0.44 - 1.00 mg/dL 1.00 0.85  0.82  Sodium 135 - 145 mmol/L 135 139 136  Potassium 3.5 - 5.1 mmol/L 4.4 3.8 4.1  Chloride 98 - 111 mmol/L 101 102 101  CO2 22 - 32 mmol/L 28 28 28   Calcium 8.9 - 10.3 mg/dL 9.3 9.5 9.3  Total Protein 6.5 - 8.1 g/dL 7.0 6.7 6.9  Total Bilirubin 0.3 - 1.2 mg/dL 0.7 0.9 1.0  Alkaline Phos 38 - 126 U/L 58 68 59  AST 15 - 41 U/L 29 30 33  ALT 0 - 44 U/L 25 40 32      RADIOGRAPHIC STUDIES: I have personally reviewed the radiological images as listed and agreed with the findings in the report. No results found.   ASSESSMENT & PLAN: Regina Vincent is a 75 y.o. female with      1. 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 S/p right  lumpectomy and SLNB. The Oncotype DX score was 16, low risk, adjuvant chemotherapy was not recommended -She declined adjuvant radiation and did not tolerate tamoxifen, stopped after 1 month in 2018.  She has been on surveillance since then. -Mammogram 03/2021 at [place] was negative -Regina Vincent is clinically doing well. She palpated a new lump in the upper central right breast, exam shows a small movable nodule at 12:00, 2 fingerbreadths above the areola.  Discussed possible etiologies including scar tissue, fibroglandular dense breast tissue, other benign tissue, versus malignancy. My suspicion for cancer is not high. We will proceed with right diagnostic mammogram and ultrasound for further work-up.  I will call her with results.   2. Osteopenia -03/24/16 DEXA revealed osteopenia with a T-Score of -2.3 in the AP spine and -0.5 in the femoral neck. -She is on Vitamin D for her VitD deficiency. She previously declined oral calcium and biphosphonate.  -She remains very active with weightbearing exercise  3. COVID-19 infection -partner brought home covid and she contracted the virus in 02/2021.  -Symptoms were dizziness, joint aches, fatigue, occasional headache, cough, exertional dyspnea, and subsequent ear infection -She completed Paxil of it and subsequent amoxicillin for ear infection -Symptoms have resolved except mild residual dizziness -She has resumed normal activity and exercise,  PLAN: -Diagnostic R mammo/US, f/up pending results    Orders Placed This Encounter  Procedures   MM DIAG BREAST TOMO UNI RIGHT    Last mamm[facility] at [facility] for [facility]    Standing Status:   Future    Standing Expiration Date:   06/17/2022    Order Specific Question:   Reason for Exam (SYMPTOM  OR DIAGNOSIS REQUIRED)    Answer:   h/o lobular carcinoma R breast 11/2015. new R lump at 12:00, 2 fingerbreadths above areola    Order Specific Question:   Preferred imaging location?    Answer:   [facility]    US BREAST LTD UNI RIGHT INC AXILLA    Last mammogram 03/2021 at [facility] for comparison    Standing Status:   Future    Standing Expiration Date:   06/17/2022    Order Specific Question:   Reason for Exam (SYMPTOM  OR DIAGNOSIS REQUIRED)    Answer:   new R breast lump 12:00 at 2 fingerbreadths above areola. h/o R breast lobular carcinoma 2017    Order Specific Question:   Preferred imaging location?    Answer:   [facility]    All questions  were answered. The patient knows to call the clinic with any problems, questions or concerns. No barriers to learning were detected.     Alla Feeling, NP 06/17/21

## 2021-06-17 ENCOUNTER — Encounter: Payer: Self-pay | Admitting: Nurse Practitioner

## 2021-06-17 ENCOUNTER — Other Ambulatory Visit: Payer: Self-pay

## 2021-06-17 ENCOUNTER — Inpatient Hospital Stay: Payer: Federal, State, Local not specified - PPO | Attending: Nurse Practitioner | Admitting: Nurse Practitioner

## 2021-06-17 ENCOUNTER — Encounter: Payer: Self-pay | Admitting: *Deleted

## 2021-06-17 VITALS — BP 146/81 | HR 83 | Temp 98.3°F | Resp 17 | Wt 106.4 lb

## 2021-06-17 DIAGNOSIS — E559 Vitamin D deficiency, unspecified: Secondary | ICD-10-CM | POA: Diagnosis not present

## 2021-06-17 DIAGNOSIS — Z170 Estrogen receptor positive status [ER+]: Secondary | ICD-10-CM | POA: Diagnosis not present

## 2021-06-17 DIAGNOSIS — Z79899 Other long term (current) drug therapy: Secondary | ICD-10-CM | POA: Diagnosis not present

## 2021-06-17 DIAGNOSIS — N6315 Unspecified lump in the right breast, overlapping quadrants: Secondary | ICD-10-CM | POA: Insufficient documentation

## 2021-06-17 DIAGNOSIS — Z8616 Personal history of COVID-19: Secondary | ICD-10-CM | POA: Insufficient documentation

## 2021-06-17 DIAGNOSIS — N6311 Unspecified lump in the right breast, upper outer quadrant: Secondary | ICD-10-CM

## 2021-06-17 DIAGNOSIS — M85859 Other specified disorders of bone density and structure, unspecified thigh: Secondary | ICD-10-CM | POA: Insufficient documentation

## 2021-06-17 DIAGNOSIS — Z886 Allergy status to analgesic agent status: Secondary | ICD-10-CM | POA: Diagnosis not present

## 2021-06-17 DIAGNOSIS — C50411 Malignant neoplasm of upper-outer quadrant of right female breast: Secondary | ICD-10-CM | POA: Diagnosis not present

## 2021-06-20 ENCOUNTER — Encounter: Payer: Self-pay | Admitting: Nurse Practitioner

## 2021-06-20 ENCOUNTER — Telehealth: Payer: Self-pay

## 2021-06-20 NOTE — Telephone Encounter (Signed)
Chart review.

## 2021-07-02 ENCOUNTER — Ambulatory Visit: Payer: Federal, State, Local not specified - PPO | Admitting: Cardiology

## 2021-07-09 ENCOUNTER — Encounter: Payer: Self-pay | Admitting: Hematology and Oncology

## 2021-07-25 ENCOUNTER — Other Ambulatory Visit: Payer: Federal, State, Local not specified - PPO

## 2021-08-04 ENCOUNTER — Encounter: Payer: Self-pay | Admitting: Hematology

## 2021-08-04 ENCOUNTER — Other Ambulatory Visit: Payer: Self-pay | Admitting: Hematology

## 2021-08-04 DIAGNOSIS — K769 Liver disease, unspecified: Secondary | ICD-10-CM

## 2021-08-08 ENCOUNTER — Ambulatory Visit: Payer: Federal, State, Local not specified - PPO | Admitting: Cardiology

## 2021-08-19 ENCOUNTER — Ambulatory Visit (HOSPITAL_COMMUNITY): Admission: RE | Admit: 2021-08-19 | Payer: Federal, State, Local not specified - PPO | Source: Ambulatory Visit

## 2021-08-27 ENCOUNTER — Inpatient Hospital Stay: Payer: Federal, State, Local not specified - PPO | Attending: Nurse Practitioner

## 2021-08-27 ENCOUNTER — Other Ambulatory Visit: Payer: Self-pay

## 2021-08-27 ENCOUNTER — Ambulatory Visit (HOSPITAL_COMMUNITY)
Admission: RE | Admit: 2021-08-27 | Discharge: 2021-08-27 | Disposition: A | Discharge status: Home / Self Care | Payer: Federal, State, Local not specified - PPO | Source: Ambulatory Visit | Attending: Hematology | Admitting: Hematology

## 2021-08-27 ENCOUNTER — Encounter: Payer: Self-pay | Admitting: Hematology

## 2021-08-27 ENCOUNTER — Inpatient Hospital Stay: Payer: Federal, State, Local not specified - PPO | Admitting: Hematology

## 2021-08-27 VITALS — BP 118/80 | HR 60 | Temp 98.0°F | Resp 18 | Ht 62.0 in | Wt 104.6 lb

## 2021-08-27 DIAGNOSIS — Z170 Estrogen receptor positive status [ER+]: Secondary | ICD-10-CM | POA: Insufficient documentation

## 2021-08-27 DIAGNOSIS — M8588 Other specified disorders of bone density and structure, other site: Secondary | ICD-10-CM

## 2021-08-27 DIAGNOSIS — Z8616 Personal history of COVID-19: Secondary | ICD-10-CM | POA: Diagnosis not present

## 2021-08-27 DIAGNOSIS — C50411 Malignant neoplasm of upper-outer quadrant of right female breast: Secondary | ICD-10-CM | POA: Insufficient documentation

## 2021-08-27 DIAGNOSIS — Z79899 Other long term (current) drug therapy: Secondary | ICD-10-CM | POA: Diagnosis not present

## 2021-08-27 DIAGNOSIS — M85851 Other specified disorders of bone density and structure, right thigh: Secondary | ICD-10-CM | POA: Insufficient documentation

## 2021-08-27 DIAGNOSIS — Z886 Allergy status to analgesic agent status: Secondary | ICD-10-CM | POA: Insufficient documentation

## 2021-08-27 DIAGNOSIS — E559 Vitamin D deficiency, unspecified: Secondary | ICD-10-CM | POA: Diagnosis not present

## 2021-08-27 DIAGNOSIS — K769 Liver disease, unspecified: Secondary | ICD-10-CM | POA: Insufficient documentation

## 2021-08-27 LAB — CBC WITH DIFFERENTIAL/PLATELET
Abs Immature Granulocytes: 0.03 10*3/uL (ref 0.00–0.07)
Basophils Absolute: 0.1 10*3/uL (ref 0.0–0.1)
Basophils Relative: 1 %
Eosinophils Absolute: 0 10*3/uL (ref 0.0–0.5)
Eosinophils Relative: 0 %
HCT: 42 % (ref 36.0–46.0)
Hemoglobin: 14.7 g/dL (ref 12.0–15.0)
Immature Granulocytes: 0 %
Lymphocytes Relative: 21 %
Lymphs Abs: 1.6 10*3/uL (ref 0.7–4.0)
MCH: 33.2 pg (ref 26.0–34.0)
MCHC: 35 g/dL (ref 30.0–36.0)
MCV: 94.8 fL (ref 80.0–100.0)
Monocytes Absolute: 0.4 10*3/uL (ref 0.1–1.0)
Monocytes Relative: 5 %
Neutro Abs: 5.6 10*3/uL (ref 1.7–7.7)
Neutrophils Relative %: 73 %
Platelets: 193 10*3/uL (ref 150–400)
RBC: 4.43 MIL/uL (ref 3.87–5.11)
RDW: 12.7 % (ref 11.5–15.5)
WBC: 7.7 10*3/uL (ref 4.0–10.5)
nRBC: 0 % (ref 0.0–0.2)

## 2021-08-27 LAB — COMPREHENSIVE METABOLIC PANEL
ALT: 23 U/L (ref 0–44)
AST: 30 U/L (ref 15–41)
Albumin: 4.5 g/dL (ref 3.5–5.0)
Alkaline Phosphatase: 52 U/L (ref 38–126)
Anion gap: 6 (ref 5–15)
BUN: 20 mg/dL (ref 8–23)
CO2: 31 mmol/L (ref 22–32)
Calcium: 9.8 mg/dL (ref 8.9–10.3)
Chloride: 97 mmol/L — ABNORMAL LOW (ref 98–111)
Creatinine, Ser: 0.97 mg/dL (ref 0.44–1.00)
GFR, Estimated: >60 mL/min (ref >60)
Glucose, Bld: 101 mg/dL — ABNORMAL HIGH (ref 70–99)
Potassium: 4.5 mmol/L (ref 3.5–5.1)
Sodium: 134 mmol/L — ABNORMAL LOW (ref 135–145)
Total Bilirubin: 0.6 mg/dL (ref 0.3–1.2)
Total Protein: 7.1 g/dL (ref 6.5–8.1)

## 2021-08-27 NOTE — Progress Notes (Signed)
[place]   Telephone:(336) [phone] Fax:(336) [phone]   Clinic Follow up Note   Patient Care Team: [name], MD as PCP - General (Family Medicine) Juanita Craver, Gerrie Nordmann, MD as Referring Physician (Hematology and Oncology) Sheliah Hatch, MD as Referring Physician (Surgical Oncology) Bonner Puna, MD as Referring Physician (Radiation Oncology) Gardenia Phlegm, NP as Nurse Practitioner (Hematology and Oncology)  Date of Service:  08/27/2021  CHIEF COMPLAINT: f/u of right breast cancer  CURRENT THERAPY:  Surveillance  ASSESSMENT & PLAN:  Regina Vincent is a 76 y.o. female with   1.  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 S/p right lumpectomy and SLNB. The Oncotype DX score was 16, low risk. -She declined adjuvant radiation and did not tolerate tamoxifen, stopped after 1 month in 2018.  She has been on surveillance since then. -Mammogram 03/2021 at [facility] was negative -She palpated a new lump in the upper central right breast, exam on 06/17/21 showed a small movable nodule at 12:00, 2 fingerbreadths above the areola. Right diagnostic MM and [place] on 06/25/21 showing 7 mm mass at 12 o'clock. Biopsy the same day confirmed grade 3 invasive lobular carcinoma, ER+/PR+/Her2-. -CT CAP and bone scan on 07/31/21 were negative, except indeterminate liver lesions. She is scheduled for abdomen MRI later today. -I thoroughly discussed the results with the patient today; she had many questions about the scan and her upcoming MRI. After extensive discussion, she would prefer to do the scan without contrast, despite being told the scan will not be as sensitive without it. I will change the order for her. -she also had questions regarding radiation therapy and antiestrogen therapy. She described to me about her  intolerance to tamoxifen, calling it an "acute injury." I do not feel she would tolerate radiation therapy given how strongly she has responded to other medications and to contrasts. I also discussed mastectomy, which may be a good option for her. I answered all her questions to the best of my ability. -she will f/u with her surgeon Dr. Glade Stanford  -she will not taking chemo, probably will not take antiestrogen therapy  -she is open to adjuvant RT now if she undergo lumpectomy  -I will see her back after surgery    2. Osteopenia -03/24/16 DEXA revealed osteopenia with a T-Score of -2.3 in the AP spine and -0.5 in the femoral neck. -She is on Vitamin D for her VitD deficiency. She previously declined oral calcium and biphosphonate.  -She remains very active with weightbearing exercise   3. COVID-19 infection 02/2021 -Symptoms were dizziness, joint aches, fatigue, occasional headache, cough, exertional dyspnea -She completed paxlovid -Symptoms recurred but she did not test positive    PLAN: -proceed with liver MRI today, will change to without contrast -f/u with breast surgeon Dr. Glade [facility] at [facility] and proceed with surgery -I will see her back 3-4 weeks after surgery to discuss additional treatment and/or surveillance. She will call me.    No problem-specific Assessment & Plan notes found for this encounter.   SUMMARY OF ONCOLOGIC HISTORY: Oncology History Overview Note  Cancer Staging Breast cancer of upper-outer quadrant of right female breast [facility]) Staging form: Breast, AJCC 7th Edition - Pathologic stage from 02/13/2017: Stage IA (T1c(m), N0, cM0) - Signed by Truitt Merle, MD on 10/27/2016     Breast cancer of upper-outer quadrant  of right female breast ([place])  12/12/2015 Mammogram   Screening Mammogram 12/12/15 UOQ right breast density.   12/25/2015 Mammogram   Bilateral diagnostic mammogram and [place] 12/25/15 1) Irregular mass with partially obscured spiculated margins measuring 1.7 cm in the  UOQ right breast. 2) USshowed a round hypoechoic mass with indistinct margins in the right breast the 10:00 position 3 cm the nipple which measured 1.1 x 0.9 x 1.1 cm. Additionally, there was a second adjacent mass in the right breast at the 10:00 position 3 cm the nipple which measured 0.5 x 0.6 cm. US of the right axilla was negative.   01/03/2016 Initial Biopsy   Right breast biopsy 01/03/16 Biopsy of the right smaller mass revealed LCIS with no invasive carcinoma identified. Biopsy of the larger right breast mass revealed grade 2 invasive lobular carcinoma.   01/03/2016 Receptors her2   ER 71-80% +, PR 91-100% +, HER2 -   01/23/2016 Breast MRI   Bilateral breast MRI 01/23/16 Adjacent enhancing masses in the right breast at 10:00, consistent with  biopsy-proven ILC and LCIS and consistent with prior mammograms and ultrasound.  The area including the two masses measured approximately 1.6 x 1.3 x 0.9 cm (although the extent of disease on ultrasound measured 3.0 cm).  No evidence of additional sites of disease.   01/24/2016 Initial Diagnosis   Breast cancer of upper-outer quadrant of right female breast ([place])   02/14/2016 Surgery   Right lumpectomy and SLN biopsy 02/14/16 Lumpectomy revealed grade 2 invasive lobular carcinoma with two foci, measuring 1.4 cm and 0.6 cm in a background of lobular carcinoma in situ, negative margins. A biopsied right axillary lymph node was negative. mpT1c, pN0   02/14/2016 Oncotype testing   Oncotype DX score was 16; indicting a recurrence risk of 10% in 5 years with Tamoxifen.   03/24/2016 Imaging   Bone density study 03/24/16  Osteopenia with a T-Score of -2.3 in the AP spine and -0.5 in the femoral neck.    2017 -  Radiation Therapy   The patient was offered radiation therapy, but declined due to her concerns of side effects.    09/11/2016 - 10/08/2016 Anti-estrogen oral therapy   Dr. Mike Gip prescribed Tamoxifen 20 mg once a day on 09/11/16. The patient stopped on  10/08/16 due to mood swings (depression), night sweats, difficulty sleeping, broken nails, headaches, right arm pain, light-headedness, chills, fever, fatigue, abdominal bloating, and abdominal pain. The patient reports her symptoms have since resolved, except for fatigue.    02/16/2018 Mammogram   02/16/2018 Mammogram FINDINGS: The breasts are heterogeneously dense, which may obscure small masses.  At the surgical site in the right breast, there is density and  architectural distortion, stable compared to the prior films and  consistent with a post surgical scar. The lumpectomy site shows no  mammographic evidence of recurrent malignancy. There are no suspicious  masses, calcifications, or other findings in either breast.    06/25/2021 Mammogram   EXAM:  MAMMO DIAGNOSTIC  BREAST WITH TOMOSYNTHESIS RIGHT MAMMO US BREAST LIMITED RIGHT  Impression  7 mm irregular shaped mass with angular margins in the right breast 12:00,  suspicious for malignancy given patient's history    06/25/2021 Pathology Results   DIAGNOSIS    A. Right breast, 12:00, heart clip, ultrasound-guided biopsy:   Invasive lobular carcinoma   - Nottingham grade 3 (tubules: 3, nuclei: 3, mitoses: 2)  - Invasive carcinoma measures 6 mm   Lobular carcinom in situ (LCIS). Lymphovascular invasion is  not identified.     % of Cells Staining Intensity Score Interpretation  ER IHC 92 3+ POSITIVE  PR IHC 97 3+ POSITIVE  HER2/neu IHC 40 2+ EQUIVOCAL    Addendum    INTERPRETATION   HER2/Neu Gene Copy Number Centromere 17 ([id number]) Gene Copy Number HER2/Neu to CEP17 Ratio Interpretation  HER2/Neu FISH  1.7 1.6 1.26 NOT AMPLIFIED        07/31/2021 Imaging   EXAM:  CT Chest, Abdomen, and Pelvis with contrast   Impression:  1. No CT evidence of acute intrathoracic or abdominopelvic pathology.  2. No suspicious nodularity or lymphadenopathy within the chest wall or axilla.  3. Multiple hypodense lesions within the liver  measuring greater than simple fluid: Dominant lesions within the left hepatic lobe silhouette characteristics consistent with hemangioma, although hypodense lesions within segment 8 and segment 5 are indeterminate. Further evaluation with contrast-enhanced liver protocol MRI may be of benefit for further characterization    07/31/2021 Imaging   EXAM:  Bone Scan   IMPRESSION: No scintigraphic evidence of osseous metastasis.       INTERVAL HISTORY:  Zorina Mallin is here for a follow up of breast cancer. She was last seen by NP Lacie on 06/17/21. She presents to the clinic alone.   All other systems were reviewed with the patient and are negative.  MEDICAL HISTORY:  Past Medical History:  Diagnosis Date   Abnormal levels of other serum enzymes    Actinic keratosis    Acute cough    Acute ethmoidal sinusitis, unspecified    Breast cancer (HCC)    COVID-19    Dehydration    Disorder of the autonomic nervous system, unspecified    Dizziness and giddiness    Headache    Hypovolemia    Other fatigue    Other specified disorders of bone density and structure, unspecified site    Otitis media    Post covid-19 condition, unspecified    Radiculopathy of cervical region    Strain of muscle, fascia and tendon at neck level, initial encounter    Syncope and collapse    Traumatic rupture of collateral ligament of right ring finger at metacarpophalangeal and interphalangeal joint, initial encounter    Unspecified eustachian tube disorder, bilateral    Vitamin D deficiency     SURGICAL HISTORY: Past Surgical History:  Procedure Laterality Date   BREAST LUMPECTOMY     BREAST LUMPECTOMY WITH AXILLARY LYMPH NODE BIOPSY Right    cataract surgery   2015    I have reviewed the social history and family history with the patient and they are unchanged from previous note.  ALLERGIES:  is allergic to naproxen, shellfish-derived products, and ibuprofen.  MEDICATIONS:  Current Outpatient  Medications  Medication Sig Dispense Refill   Multiple Vitamins-Minerals (MULTIVITAMIN WOMENS 50+ ADV PO) Take 1 tablet by mouth daily.     Omega-3 Fatty Acids (FISH OIL) 600 MG CAPS Take 1 capsule by mouth daily.     No current facility-administered medications for this visit.    PHYSICAL EXAMINATION: ECOG PERFORMANCE STATUS: 1 - Symptomatic but completely ambulatory  Vitals:   08/27/21 1419  BP: 118/80  Pulse: 60  Resp: 18  Temp: 98 F (36.7 C)  SpO2: 100%   Wt Readings from Last 3 Encounters:  08/27/21 104 lb 9.6 oz (47.4 kg)  06/17/21 106 lb 6.4 oz (48.3 kg)  08/27/20 106 lb 14.4 oz (48.5 kg)     GENERAL:alert, no distress and comfortable SKIN:  skin color normal, no rashes or significant lesions EYES: normal, Conjunctiva are pink and non-injected, sclera clear  NEURO: alert & oriented x 3 with fluent speech  LABORATORY DATA:  I have reviewed the data as listed CBC Latest Ref Rng & Units 08/27/2021 08/27/2020 06/22/2019  WBC 4.0 - 10.5 K/uL 7.7 7.8 7.6  Hemoglobin 12.0 - 15.0 g/dL 14.7 14.5 14.1  Hematocrit 36.0 - 46.0 % 42.0 41.4 40.3  Platelets 150 - 400 K/uL 193 209 155     CMP Latest Ref Rng & Units 08/27/2021 08/27/2020 06/22/2019  Glucose 70 - 99 mg/dL 101(H) 80 75  BUN 8 - 23 mg/dL 20 18 21   Creatinine 0.44 - 1.00 mg/dL 0.97 1.00 0.85  Sodium 135 - 145 mmol/L 134(L) 135 139  Potassium 3.5 - 5.1 mmol/L 4.5 4.4 3.8  Chloride 98 - 111 mmol/L 97(L) 101 102  CO2 22 - 32 mmol/L 31 28 28   Calcium 8.9 - 10.3 mg/dL 9.8 9.3 9.5  Total Protein 6.5 - 8.1 g/dL 7.1 7.0 6.7  Total Bilirubin 0.3 - 1.2 mg/dL 0.6 0.7 0.9  Alkaline Phos 38 - 126 U/L 52 58 68  AST 15 - 41 U/L 30 29 30   ALT 0 - 44 U/L 23 25 40      RADIOGRAPHIC STUDIES: I have personally reviewed the radiological images as listed and agreed with the findings in the report. No results found.    No orders of the defined types were placed in this encounter.  All questions were answered. The patient knows  to call the clinic with any problems, questions or concerns. No barriers to learning was detected. The total time spent in the appointment was 40 minutes.     Truitt Merle, MD 08/27/2021   I, Wilburn Mylar, am acting as scribe for Truitt Merle, MD.   I have reviewed the above documentation for accuracy and completeness, and I agree with the above.

## 2021-08-27 NOTE — Progress Notes (Signed)
Per [place] at [place] office. Pt refusing MRI contrast. Ok to proceed with, without contrast study per ordering MD.

## 2021-08-29 ENCOUNTER — Encounter: Payer: Self-pay | Admitting: Hematology

## 2021-09-10 ENCOUNTER — Encounter: Payer: Self-pay | Admitting: Hematology

## 2021-09-19 ENCOUNTER — Ambulatory Visit: Payer: Federal, State, Local not specified - PPO | Admitting: Cardiology

## 2021-10-20 ENCOUNTER — Telehealth: Payer: Self-pay

## 2021-10-20 NOTE — Telephone Encounter (Signed)
Spoke with pt via telephone regarding f/u appt with Dr. Burr Medico post breast surgery at [facility] in February 2023.  Pt stated she is agreeable to having a f/u appt if Dr. Burr Medico has gotten the post surgery/pathology report from [facility].  Informed pt that this RN is ask Dr. Burr Medico regarding information from [place].  If Dr Burr Medico has heard back from [facility], pt is agreeable to schedule a f/u Dr Burr Medico w/in the next 1 to 2 wks. ?

## 2021-10-22 ENCOUNTER — Encounter: Payer: Self-pay | Admitting: Hematology

## 2021-10-22 ENCOUNTER — Telehealth: Payer: Self-pay | Admitting: Hematology

## 2021-10-22 NOTE — Telephone Encounter (Signed)
.  Called patient to schedule appointment per 3/7 inbasket, patient is aware of date and time.  ? ?Patient requested to r/s at the end of April. ? ?

## 2021-11-03 ENCOUNTER — Other Ambulatory Visit: Payer: Federal, State, Local not specified - PPO

## 2021-11-03 ENCOUNTER — Ambulatory Visit: Payer: Federal, State, Local not specified - PPO | Admitting: Hematology

## 2021-11-10 NOTE — Progress Notes (Incomplete)
?Cardiology Office Note:   ? ?Date:  11/10/2021  ? ?ID:  Regina Vincent, DOB 02-Sep-1945, MRN [id number] ? ?PCP:  [name], MD  ?Cardiologist:  None   ? ?Referring MD: [name], MD  ? ?No chief complaint on file. ? ? ?History of Present Illness:   ? ?Regina Vincent is a 76 y.o. female with a hx of COVID-19 here today for exercise intolerance post-COVID infection at the request of Dr. Darron Doom. 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  ? ?Today, *** ? ?Past Medical History:  ?Diagnosis Date  ? Abnormal levels of other serum enzymes   ? Actinic keratosis   ? Acute cough   ? Acute ethmoidal sinusitis, unspecified   ? Breast cancer ([place])   ? COVID-19   ? Dehydration   ? Disorder of the autonomic nervous system, unspecified   ? Dizziness and giddiness   ? Headache   ? Hypovolemia   ? Other fatigue   ? Other specified disorders of bone density and structure, unspecified site   ? Otitis media   ? Post covid-19 condition, unspecified   ? Radiculopathy of cervical region   ? Strain of muscle, fascia and tendon at neck level, initial encounter   ? Syncope and collapse   ? Traumatic rupture of collateral ligament of right ring finger at metacarpophalangeal and interphalangeal joint, initial encounter   ? Unspecified eustachian tube disorder, bilateral   ? Vitamin D deficiency   ? ? ?Past Surgical History:  ?Procedure Laterality Date  ? BREAST LUMPECTOMY    ? BREAST LUMPECTOMY WITH AXILLARY LYMPH NODE BIOPSY Right   ? cataract surgery   2015  ? ? ?Current Medications: ?No outpatient medications have been marked as taking for the 11/12/21 encounter (Appointment) with Jerline Pain, MD.  ?  ? ?Allergies:   Naproxen, Shellfish-derived products, and Ibuprofen  ? ?Social History  ? ?Socioeconomic History  ? Marital status: Single  ?  Spouse name: Not on file  ? Number of children: Not on file  ? Years of education: Not on file  ? Highest education level: Not on file  ?Occupational History  ? Not on file  ?Tobacco  Use  ? Smoking status: Former  ?  Years: 3.00  ?  Types: Cigarettes  ?  Quit date: 08/25/1972  ?  Years since quitting: 49.2  ? Smokeless tobacco: Never  ?Substance and Sexual Activity  ? Alcohol use: Yes  ?  Alcohol/week: 2.0 standard drinks  ?  Types: 1 Glasses of wine, 1 Cans of beer per week  ?  Comment: once a week  ? Drug use: No  ? Sexual activity: Yes  ?  Birth control/protection: Post-menopausal  ?Other Topics Concern  ? Not on file  ?Social History Narrative  ? Not on file  ? ?Social Determinants of Health  ? ?Financial Resource Strain: Not on file  ?Food Insecurity: Not on file  ?Transportation Needs: Not on file  ?Physical Activity: Not on file  ?Stress: Not on file  ?Social Connections: Not on file  ?  ? ?Family History: ?The patient's family history includes Cancer in her brother; Cancer (age of onset: 42) in her brother; Diabetes in her father and mother; Hypertension in her father and mother. ? ?ROS:   ?Please see the history of present illness.    ?All other systems reviewed and negative.  ? ?EKGs/Labs/Other Studies Reviewed:   ? ?The following studies were reviewed today: ?Chest CT  07/31/21 ([place]) ?Impression:  ?1. No CT evidence of acute intrathoracic or abdominopelvic pathology.  ?2. No suspicious nodularity or lymphadenopathy within the chest wall or axilla.  ?3. Multiple hypodense lesions within the liver measuring greater than simple fluid: Dominant lesions within the left hepatic lobe silhouette characteristics consistent with hemangioma, although hypodense lesions within segment 8 and segment 5 are indeterminate. Further evaluation with  ?contrast-enhanced liver protocol MRI may be of benefit for further characterization  ? ?EKG:  EKG was personally reviewed ?11/12/21: Sinus ***, rate *** bpm ? ?Recent Labs: ?08/27/2021: ALT 23; BUN 20; Creatinine, Ser 0.97; Hemoglobin 14.7; Platelets 193; Potassium 4.5; Sodium 134  ? ?Recent Lipid Panel ?No results found for: CHOL,  TRIG, HDL, CHOLHDL, VLDL, LDLCALC, LDLDIRECT ? ?CHA2DS2-VASc Score =   '[ ]'$ .  Therefore, the patient's annual risk of stroke is   %.    ?  ? ? ?Physical Exam:   ? ?VS:  There were no vitals taken for this visit.   ? ?Wt Readings from Last 3 Encounters:  ?08/27/21 104 lb 9.6 oz (47.4 kg)  ?06/17/21 106 lb 6.4 oz (48.3 kg)  ?08/27/20 106 lb 14.4 oz (48.5 kg)  ?  ? ?GEN: Well nourished, well developed in no acute distress ?HEENT: Normal ?NECK: No JVD; No carotid bruits ?LYMPHATICS: No lymphadenopathy ?CARDIAC: RRR, no murmurs, rubs, gallops ?RESPIRATORY:  Clear to auscultation without rales, wheezing or rhonchi  ?ABDOMEN: Soft, non-tender, non-distended ?MUSCULOSKELETAL:  No edema; No deformity  ?SKIN: Warm and dry ?NEUROLOGIC:  Alert and oriented x 3 ?PSYCHIATRIC:  Normal affect  ? ?ASSESSMENT:   ? ?No diagnosis found. ?PLAN:   ? ?No problem-specific Assessment & Plan notes found for this encounter. ? ? ?  ? ?Medication Adjustments/Labs and Tests Ordered: ?Current medicines are reviewed at length with the patient today.  Concerns regarding medicines are outlined above.  ?No orders of the defined types were placed in this encounter. ? ?No orders of the defined types were placed in this encounter. ? ?I,Mykaella Javier,acting as a scribe for [employer], MD.,have documented all relevant documentation on the behalf of Candee Furbish, MD,as directed by  Candee Furbish, MD while in the presence of Candee Furbish, MD. ? ?*** ? ?Signed, ?Mykaella Garlon Hatchet  ?11/10/2021 9:56 AM    ?[place] ? ?

## 2021-11-12 ENCOUNTER — Ambulatory Visit: Payer: Federal, State, Local not specified - PPO | Admitting: Cardiology

## 2021-12-09 ENCOUNTER — Other Ambulatory Visit: Payer: Federal, State, Local not specified - PPO

## 2021-12-09 ENCOUNTER — Ambulatory Visit: Payer: Federal, State, Local not specified - PPO | Admitting: Hematology

## 2021-12-15 NOTE — Progress Notes (Incomplete)
?Cardiology Office Note:   ? ?Date:  12/15/2021  ? ?ID:  Regina Vincent, DOB 08-31-1945, MRN [id number] ? ?PCP:  [name], MD ?  ?[facility] Providers ?Cardiologist:  None { ?Click to update primary MD,subspecialty MD or APP then REFRESH:1}   ? ?Referring MD: [name], MD  ? ?History of Present Illness:   ? ?Regina Vincent is a 76 y.o. female here for the evaluation of *** at the request of *** ? ?Today: ?Overall, ? ?*** denies any palpitations, chest pain, shortness of breath, or peripheral edema. No lightheadedness, headaches, syncope, orthopnea, or PND. ? ?(+) ? ?Past Medical History:  ?Diagnosis Date  ? Abnormal levels of other serum enzymes   ? Actinic keratosis   ? Acute cough   ? Acute ethmoidal sinusitis, unspecified   ? Breast cancer ([place])   ? COVID-19   ? Dehydration   ? Disorder of the autonomic nervous system, unspecified   ? Dizziness and giddiness   ? Headache   ? Hypovolemia   ? Other fatigue   ? Other specified disorders of bone density and structure, unspecified site   ? Otitis media   ? Post covid-19 condition, unspecified   ? Radiculopathy of cervical region   ? Strain of muscle, fascia and tendon at neck level, initial encounter   ? Syncope and collapse   ? Traumatic rupture of collateral ligament of right ring finger at metacarpophalangeal and interphalangeal joint, initial encounter   ? Unspecified eustachian tube disorder, bilateral   ? Vitamin D deficiency   ? ? ?Past Surgical History:  ?Procedure Laterality Date  ? BREAST LUMPECTOMY    ? BREAST LUMPECTOMY WITH AXILLARY LYMPH NODE BIOPSY Right   ? cataract surgery   2015  ? ? ?Current Medications: ?No outpatient medications have been marked as taking for the 12/16/21 encounter (Appointment) with Jerline Pain, MD.  ?  ? ?Allergies:   Naproxen, Shellfish-derived products, and Ibuprofen  ? ?Social History  ? ?Socioeconomic History  ? Marital status: Single  ?  Spouse name: Not on file  ? Number of children: Not on file  ?  Years of education: Not on file  ? Highest education level: Not on file  ?Occupational History  ? Not on file  ?Tobacco Use  ? Smoking status: Former  ?  Years: 3.00  ?  Types: Cigarettes  ?  Quit date: 08/25/1972  ?  Years since quitting: 49.3  ? Smokeless tobacco: Never  ?Substance and Sexual Activity  ? Alcohol use: Yes  ?  Alcohol/week: 2.0 standard drinks  ?  Types: 1 Glasses of wine, 1 Cans of beer per week  ?  Comment: once a week  ? Drug use: No  ? Sexual activity: Yes  ?  Birth control/protection: Post-menopausal  ?Other Topics Concern  ? Not on file  ?Social History Narrative  ? Not on file  ? ?Social Determinants of Health  ? ?Financial Resource Strain: Not on file  ?Food Insecurity: Not on file  ?Transportation Needs: Not on file  ?Physical Activity: Not on file  ?Stress: Not on file  ?Social Connections: Not on file  ?  ? ?Family History: ?The patient's family history includes Cancer in her brother; Cancer (age of onset: 28) in her brother; Diabetes in her father and mother; Hypertension in her father and mother. ? ?ROS:   ?Please see the history of present illness.    ? ?All other systems reviewed and are negative. ? ?EKGs/Labs/Other  Studies Reviewed:   ? ?The following studies were reviewed today: ? ?*** ? ?EKG:  EKG is personally reviewed and interpreted. ?: Sinus ***. Rate *** bpm. ? ?Recent Labs: ?08/27/2021: ALT 23; BUN 20; Creatinine, Ser 0.97; Hemoglobin 14.7; Platelets 193; Potassium 4.5; Sodium 134  ? ?Recent Lipid Panel ?No results found for: CHOL, TRIG, HDL, CHOLHDL, VLDL, LDLCALC, LDLDIRECT ? ? ?Risk Assessment/Calculations:   ?{Does this patient have ATRIAL FIBRILLATION?:[phone]} ? ?    ? ?Physical Exam:   ? ?VS:  There were no vitals taken for this visit.   ? ?Wt Readings from Last 3 Encounters:  ?08/27/21 104 lb 9.6 oz (47.4 kg)  ?06/17/21 106 lb 6.4 oz (48.3 kg)  ?08/27/20 106 lb 14.4 oz (48.5 kg)  ?  ? ?GEN: Well nourished, well developed in no acute distress ?HEENT: Normal ?NECK: No  JVD; No carotid bruits ?LYMPHATICS: No lymphadenopathy ?CARDIAC: RRR, no murmurs, rubs, gallops ?RESPIRATORY:  Clear to auscultation without rales, wheezing or rhonchi  ?ABDOMEN: Soft, non-tender, non-distended ?MUSCULOSKELETAL:  No edema; No deformity  ?SKIN: Warm and dry ?NEUROLOGIC:  Alert and oriented x 3 ?PSYCHIATRIC:  Normal affect  ? ?ASSESSMENT:   ? ?No diagnosis found. ?PLAN:   ? ?In order of problems listed above: ? ?No problem-specific Assessment & Plan notes found for this encounter. ? ? ? ?{Are you ordering a CV Procedure (e.g. stress test, cath, DCCV, TEE, etc)?   Press F2        :[id number]}  ? ?Follow-up: *** months. ? ?Medication Adjustments/Labs and Tests Ordered: ?Current medicines are reviewed at length with the patient today.  Concerns regarding medicines are outlined above.  ? ?No orders of the defined types were placed in this encounter. ? ?No orders of the defined types were placed in this encounter. ? ?There are no Patient Instructions on file for this visit.  ? ?I,Mathew Stumpf,acting as a Education administrator for [employer], MD.,have documented all relevant documentation on the behalf of Regina Furbish, MD,as directed by  Regina Furbish, MD while in the presence of Regina Furbish, MD. ? ?*** ? ?Signed, ?Madelin Rear  ?12/15/2021 3:24 PM    ?[place] ?

## 2021-12-16 ENCOUNTER — Ambulatory Visit: Payer: Federal, State, Local not specified - PPO | Admitting: Cardiology

## 2022-01-01 ENCOUNTER — Inpatient Hospital Stay: Payer: Federal, State, Local not specified - PPO | Admitting: Hematology

## 2022-01-01 ENCOUNTER — Inpatient Hospital Stay: Payer: Federal, State, Local not specified - PPO

## 2022-01-06 ENCOUNTER — Ambulatory Visit: Payer: Federal, State, Local not specified - PPO | Admitting: Hematology

## 2022-01-06 ENCOUNTER — Other Ambulatory Visit: Payer: Federal, State, Local not specified - PPO

## 2022-02-05 ENCOUNTER — Other Ambulatory Visit: Payer: Self-pay

## 2022-02-05 ENCOUNTER — Inpatient Hospital Stay: Payer: Federal, State, Local not specified - PPO | Attending: Hematology

## 2022-02-05 ENCOUNTER — Inpatient Hospital Stay: Payer: Federal, State, Local not specified - PPO | Admitting: Hematology

## 2022-02-05 ENCOUNTER — Encounter: Payer: Self-pay | Admitting: Hematology

## 2022-02-05 VITALS — BP 110/68 | HR 70 | Temp 98.0°F | Resp 18 | Ht 62.0 in | Wt 108.7 lb

## 2022-02-05 DIAGNOSIS — K769 Liver disease, unspecified: Secondary | ICD-10-CM

## 2022-02-05 DIAGNOSIS — M8589 Other specified disorders of bone density and structure, multiple sites: Secondary | ICD-10-CM | POA: Diagnosis not present

## 2022-02-05 DIAGNOSIS — M8588 Other specified disorders of bone density and structure, other site: Secondary | ICD-10-CM

## 2022-02-05 DIAGNOSIS — E559 Vitamin D deficiency, unspecified: Secondary | ICD-10-CM | POA: Diagnosis not present

## 2022-02-05 DIAGNOSIS — Z79899 Other long term (current) drug therapy: Secondary | ICD-10-CM | POA: Diagnosis not present

## 2022-02-05 DIAGNOSIS — R5383 Other fatigue: Secondary | ICD-10-CM | POA: Insufficient documentation

## 2022-02-05 DIAGNOSIS — Z8616 Personal history of COVID-19: Secondary | ICD-10-CM | POA: Diagnosis not present

## 2022-02-05 DIAGNOSIS — C50411 Malignant neoplasm of upper-outer quadrant of right female breast: Secondary | ICD-10-CM | POA: Insufficient documentation

## 2022-02-05 DIAGNOSIS — Z170 Estrogen receptor positive status [ER+]: Secondary | ICD-10-CM | POA: Insufficient documentation

## 2022-02-05 DIAGNOSIS — Z886 Allergy status to analgesic agent status: Secondary | ICD-10-CM | POA: Diagnosis not present

## 2022-02-05 LAB — COMPREHENSIVE METABOLIC PANEL
ALT: 14 U/L (ref 0–44)
AST: 22 U/L (ref 15–41)
Albumin: 4.3 g/dL (ref 3.5–5.0)
Alkaline Phosphatase: 54 U/L (ref 38–126)
Anion gap: 6 (ref 5–15)
BUN: 17 mg/dL (ref 8–23)
CO2: 30 mmol/L (ref 22–32)
Calcium: 9.7 mg/dL (ref 8.9–10.3)
Chloride: 100 mmol/L (ref 98–111)
Creatinine, Ser: 0.78 mg/dL (ref 0.44–1.00)
GFR, Estimated: >60 mL/min (ref >60)
Glucose, Bld: 81 mg/dL (ref 70–99)
Potassium: 4.3 mmol/L (ref 3.5–5.1)
Sodium: 136 mmol/L (ref 135–145)
Total Bilirubin: 0.5 mg/dL (ref 0.3–1.2)
Total Protein: 7.1 g/dL (ref 6.5–8.1)

## 2022-02-05 LAB — CBC WITH DIFFERENTIAL/PLATELET
Abs Immature Granulocytes: 0.01 10*3/uL (ref 0.00–0.07)
Basophils Absolute: 0.1 10*3/uL (ref 0.0–0.1)
Basophils Relative: 1 %
Eosinophils Absolute: 0.1 10*3/uL (ref 0.0–0.5)
Eosinophils Relative: 2 %
HCT: 41.7 % (ref 36.0–46.0)
Hemoglobin: 14.6 g/dL (ref 12.0–15.0)
Immature Granulocytes: 0 %
Lymphocytes Relative: 24 %
Lymphs Abs: 1.6 10*3/uL (ref 0.7–4.0)
MCH: 33.5 pg (ref 26.0–34.0)
MCHC: 35 g/dL (ref 30.0–36.0)
MCV: 95.6 fL (ref 80.0–100.0)
Monocytes Absolute: 0.4 10*3/uL (ref 0.1–1.0)
Monocytes Relative: 6 %
Neutro Abs: 4.4 10*3/uL (ref 1.7–7.7)
Neutrophils Relative %: 67 %
Platelets: 213 10*3/uL (ref 150–400)
RBC: 4.36 MIL/uL (ref 3.87–5.11)
RDW: 12.6 % (ref 11.5–15.5)
WBC: 6.5 10*3/uL (ref 4.0–10.5)
nRBC: 0 % (ref 0.0–0.2)

## 2022-02-09 ENCOUNTER — Telehealth: Payer: Self-pay | Admitting: Hematology

## 2022-02-13 NOTE — Progress Notes (Signed)
Cardiology Office Note:    Date:  02/16/2022   ID:  Regina Vincent, DOB Dec 23, 1945, MRN [id number]  PCP:  Regina Rasmussen, MD   [facility] Providers Cardiologist:  Lenna Sciara, MD Referring MD: Regina Rasmussen, MD   Chief Complaint/Reason for Referral: Dyspnea   ASSESSMENT:    1. Dyspnea, unspecified type     PLAN:    In order of problems listed above: 1.  Dyspnea: I will obtain an exercise treadmill stress test and echocardiogram to evaluate further.  If these are reassuring then I think a pulmonary evaluation may be required.  I will keep follow-up with me open-ended.       Dispo:  Return if symptoms worsen or fail to improve.      Medication Adjustments/Labs and Tests Ordered: Current medicines are reviewed at length with the patient today.  Concerns regarding medicines are outlined above.  The following changes have been made:  no change   Labs/tests ordered: Orders Placed This Encounter  Procedures   Exercise Tolerance Test   EKG 12-Lead   ECHOCARDIOGRAM COMPLETE    Medication Changes: No orders of the defined types were placed in this encounter.    Current medicines are reviewed at length with the patient today.  The patient does not have concerns regarding medicines.   History of Present Illness:    FOCUSED PROBLEM LIST:   1.  Breast cancer status post right lumpectomy and XRT with indeterminate lesions of the liver and pancreas  2.  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 female with the indicated medical history here for recommendations regarding dyspnea and exercise intolerance especially after COVID infection in 2022.  She had labs drawn which were relatively unremarkable and included a normal creatinine, hemoglobin A1c, TSH, and hemoglobin.    The patient tells me that she contracted COVID in July 2022.  She recuperated and was able to get back to running.  She was in a very high performing  runner.  She then contracted an upper respiratory infection which may have been the flu.  Since that time she has not been able to perform up to her prior standards.  She gets quite dyspneic.  She can only run 12-minute miles and she used to be much faster than this.  She denies any exertional angina with running.  She occasionally gets lightheaded when going from sitting to standing quickly.  She denies any  frequent palpitations.  She denies any peripheral edema, paroxysmal nocturnal dyspnea, orthopnea.  She has not required emergency room visits or hospitalizations recently for any reason.      Current Medications: Current Meds  Medication Sig   Multiple Vitamins-Minerals (MULTIVITAMIN WOMENS 50+ ADV PO) Take 1 tablet by mouth daily.   Omega-3 Fatty Acids (OMEGA-3 FISH OIL PO) Take 60 mg by mouth in the morning and at bedtime.     Allergies:    Naproxen, Shellfish-derived products, and Ibuprofen   Social History:   Social History   Tobacco Use   Smoking status: Former    Years: 3.00    Types: Cigarettes    Quit date: 08/25/1972    Years since quitting: 49.5   Smokeless tobacco: Never  Substance Use Topics   Alcohol use: Yes    Alcohol/week: 2.0 standard drinks of alcohol    Types: 1 Glasses of wine, 1 Cans of beer per week    Comment: once a week   Drug use:  No     Family Hx: Family History  Problem Relation Age of Onset   Diabetes Mother    Hypertension Mother    Diabetes Father    Hypertension Father    Cancer Brother 56       prostate   Cancer Brother        lung cancer     Review of Systems:   Please see the history of present illness.    All other systems reviewed and are negative.     EKGs/Labs/Other Test Reviewed:    EKG: EKG performed August 2022 demonstrates sinus bradycardia with an RSR prime; EKG performed today that I personally reviewed demonstrates normal sinus rhythm.  Prior CV studies: None available  Other studies Reviewed: Review of the  additional studies/records demonstrates: MRI of liver demonstrates multiple liver lesions and a pancreatic lesion  Recent Labs: 02/05/2022: ALT 14; BUN 17; Creatinine, Ser 0.78; Hemoglobin 14.6; Platelets 213; Potassium 4.3; Sodium 136   Recent Lipid Panel No results found for: "CHOL", "TRIG", "HDL", "LDLCALC", "LDLDIRECT"  Risk Assessment/Calculations:           Physical Exam:    VS:  BP 130/80 (BP Location: Left Arm, Patient Position: Sitting, Cuff Size: Normal)   Pulse 64   Ht '5\' 2"'$  (1.575 m)   Wt 104 lb (47.2 kg)   BMI 19.02 kg/m    Wt Readings from Last 3 Encounters:  02/16/22 104 lb (47.2 kg)  02/05/22 108 lb 11.2 oz (49.3 kg)  08/27/21 104 lb 9.6 oz (47.4 kg)    GENERAL:  No apparent distress, AOx3 HEENT:  No carotid bruits, +2 carotid impulses, no scleral icterus CAR: RRR no murmurs, gallops, rubs, or thrills RES:  Clear to auscultation bilaterally ABD:  Soft, nontender, nondistended, positive bowel sounds x 4 VASC:  +2 radial pulses, +2 carotid pulses, palpable pedal pulses NEURO:  CN 2-12 grossly intact; motor and sensory grossly intact PSYCH:  No active depression or anxiety EXT:  No edema, ecchymosis, or cyanosis  Signed, Early Osmond, MD  02/16/2022 1:32 PM    [address], [place], [place]  [address] Phone: [phone]; Fax: [phone]   Note:  This document was prepared using Dragon voice recognition software and may include unintentional dictation errors.

## 2022-02-16 ENCOUNTER — Encounter: Payer: Self-pay | Admitting: Internal Medicine

## 2022-02-16 ENCOUNTER — Ambulatory Visit: Payer: Federal, State, Local not specified - PPO | Admitting: Internal Medicine

## 2022-02-16 VITALS — BP 130/80 | HR 64 | Ht 62.0 in | Wt 104.0 lb

## 2022-02-16 DIAGNOSIS — R0600 Dyspnea, unspecified: Secondary | ICD-10-CM

## 2022-02-16 NOTE — Patient Instructions (Signed)
Medication Instructions:  Your physician recommends that you continue on your current medications as directed. Please refer to the Current Medication list given to you today.  *If you need a refill on your cardiac medications before your next appointment, please call your pharmacy*   Testing/Procedures: ECHO Your physician has requested that you have an echocardiogram. Echocardiography is a painless test that uses sound waves to create images of your heart. It provides your doctor with information about the size and shape of your heart and how well your heart's chambers and valves are working. This procedure takes approximately one hour. There are no restrictions for this procedure.   Stress test Your physician has requested that you have an exercise tolerance test. For further information please visit [email or web address]. Please also follow instruction sheet, as given.   Follow-Up: At [facility], you and your health needs are our priority.  As part of our continuing mission to provide you with exceptional heart care, we have created designated Provider Care Teams.  These Care Teams include your primary Cardiologist (physician) and Advanced Practice Providers (APPs -  Physician Assistants and Nurse Practitioners) who all work together to provide you with the care you need, when you need it.   Your next appointment:   As needed   Provider:   Early Osmond, MD    Important Information About Sugar

## 2022-02-25 ENCOUNTER — Telehealth: Payer: Self-pay | Admitting: Hematology

## 2022-02-25 ENCOUNTER — Encounter: Payer: Self-pay | Admitting: Hematology

## 2022-02-25 NOTE — Telephone Encounter (Signed)
Per 7/12 phone line pt called and requested to cancel appointment.  Appointment was canceled per pt request

## 2022-03-03 ENCOUNTER — Ambulatory Visit (HOSPITAL_COMMUNITY): Payer: Federal, State, Local not specified - PPO | Attending: Cardiology

## 2022-03-03 ENCOUNTER — Ambulatory Visit (INDEPENDENT_AMBULATORY_CARE_PROVIDER_SITE_OTHER): Payer: Federal, State, Local not specified - PPO

## 2022-03-03 DIAGNOSIS — R0600 Dyspnea, unspecified: Secondary | ICD-10-CM

## 2022-03-03 LAB — EXERCISE TOLERANCE TEST
Angina Index: 0
Duke Treadmill Score: 10
Estimated workload: 10.9
Exercise duration (min): 9 min
Exercise duration (sec): 30 s
MPHR: 147 {beats}/min
Peak HR: 148 {beats}/min
Percent HR: 100 %
RPE: 15
Rest HR: 61 {beats}/min
ST Depression (mm): 0 mm

## 2022-03-03 LAB — ECHOCARDIOGRAM COMPLETE
Area-P 1/2: 4.06 cm2
S' Lateral: 2.4 cm

## 2022-03-17 ENCOUNTER — Ambulatory Visit: Payer: Federal, State, Local not specified - PPO | Admitting: Hematology

## 2022-03-24 ENCOUNTER — Ambulatory Visit (HOSPITAL_COMMUNITY)
Admission: RE | Admit: 2022-03-24 | Discharge: 2022-03-24 | Disposition: A | Discharge status: Home / Self Care | Payer: Federal, State, Local not specified - PPO | Source: Ambulatory Visit | Attending: Hematology | Admitting: Hematology

## 2022-03-24 DIAGNOSIS — K769 Liver disease, unspecified: Secondary | ICD-10-CM | POA: Insufficient documentation

## 2022-03-29 ENCOUNTER — Encounter: Payer: Self-pay | Admitting: Hematology

## 2023-01-04 NOTE — Progress Notes (Unsigned)
    Regina Vincent [facility] [address] [place] [address] Phone: [phone]   Assessment and Plan:     There are no diagnoses linked to this encounter.  ***   Pertinent previous records reviewed include ***   Follow Up: ***     Subjective:   I, Regina Vincent, am serving as a Neurosurgeon for Doctor Richardean Sale  Chief Complaint: left knee pain   HPI:   01/05/2023 Patient is a 77 year old female complaining of left knee pain. Patient states  Relevant Historical Information: ***  Additional pertinent review of systems negative.   Current Outpatient Medications:    Multiple Vitamins-Minerals (MULTIVITAMIN WOMENS 50+ ADV PO), Take 1 tablet by mouth daily., Disp: , Rfl:    Omega-3 Fatty Acids (OMEGA-3 FISH OIL PO), Take 60 mg by mouth in the morning and at bedtime., Disp: , Rfl:    Vitamin D, Ergocalciferol, (DRISDOL) 1.25 MG (50000 UNIT) CAPS capsule, Take 50,000 Units by mouth once a week., Disp: , Rfl:    Objective:     There were no vitals filed for this visit.    There is no height or weight on file to calculate BMI.    Physical Exam:    ***   Electronically signed by:  Regina [facility] 11:50 AM 01/04/23

## 2023-01-05 ENCOUNTER — Ambulatory Visit (INDEPENDENT_AMBULATORY_CARE_PROVIDER_SITE_OTHER): Payer: Federal, State, Local not specified - PPO

## 2023-01-05 ENCOUNTER — Ambulatory Visit: Payer: Federal, State, Local not specified - PPO | Admitting: Sports Medicine

## 2023-01-05 VITALS — BP 128/80 | HR 68 | Ht 62.0 in | Wt 107.0 lb

## 2023-01-05 DIAGNOSIS — M25562 Pain in left knee: Secondary | ICD-10-CM

## 2023-01-05 DIAGNOSIS — G8929 Other chronic pain: Secondary | ICD-10-CM

## 2023-01-05 MED ORDER — METHYLPREDNISOLONE 4 MG PO TBPK: ORAL_TABLET | ORAL | 0 refills | Status: DC

## 2023-01-05 NOTE — Patient Instructions (Signed)
Prednisone dos pak  Knee HEP  PT referral  3-4 week follow up  Tylenol [phone] mg 2-3 times a day for pain relief

## 2023-01-26 ENCOUNTER — Ambulatory Visit: Payer: Federal, State, Local not specified - PPO

## 2023-01-28 ENCOUNTER — Other Ambulatory Visit: Payer: Self-pay

## 2023-01-28 ENCOUNTER — Ambulatory Visit: Payer: Federal, State, Local not specified - PPO | Attending: Sports Medicine | Admitting: Physical Therapy

## 2023-01-28 DIAGNOSIS — M25562 Pain in left knee: Secondary | ICD-10-CM | POA: Diagnosis present

## 2023-01-28 DIAGNOSIS — M6281 Muscle weakness (generalized): Secondary | ICD-10-CM | POA: Diagnosis present

## 2023-01-28 DIAGNOSIS — G8929 Other chronic pain: Secondary | ICD-10-CM | POA: Insufficient documentation

## 2023-01-28 NOTE — Therapy (Signed)
OUTPATIENT PHYSICAL THERAPY LOWER EXTREMITY EVALUATION  Patient Name: Regina Vincent MRN: [id number] DOB:1945-11-11, 77 y.o., female Today's Date: 01/29/2023   PT End of Session - 01/29/23 0806     Visit Number 1    Number of Visits --   1-2x/week   Date for PT Re-Evaluation 03/26/23    Authorization Type [employer] - MCR? - FOTO    Progress Note Due on Visit 10    PT Start Time 1130    PT Stop Time 1213    PT Time Calculation (min) 43 min             Past Medical History:  Diagnosis Date   Abnormal levels of other serum enzymes    Actinic keratosis    Acute cough    Acute ethmoidal sinusitis, unspecified    Breast cancer (HCC)    COVID-19    Dehydration    Disorder of the autonomic nervous system, unspecified    Dizziness and giddiness    Headache    Hypovolemia    Other fatigue    Other specified disorders of bone density and structure, unspecified site    Otitis media    Post covid-19 condition, unspecified    Radiculopathy of cervical region    Strain of muscle, fascia and tendon at neck level, initial encounter    Syncope and collapse    Traumatic rupture of collateral ligament of right ring finger at metacarpophalangeal and interphalangeal joint, initial encounter    Unspecified eustachian tube disorder, bilateral    Vitamin D deficiency    Past Surgical History:  Procedure Laterality Date   BREAST LUMPECTOMY     BREAST LUMPECTOMY WITH AXILLARY LYMPH NODE BIOPSY Right    cataract surgery   2015   Patient Active Problem List   Diagnosis Date Noted   Osteopenia 08/25/2016   Breast cancer of upper-outer quadrant of right female breast (HCC) 01/24/2016    PCP: Dois Davenport, MD  REFERRING PROVIDER: Richardean Sale, DO  THERAPY DIAG:  Left knee pain, unspecified chronicity  Muscle weakness  REFERRING DIAG: L knee pain  Rationale for Evaluation and Treatment:  Rehabilitation  SUBJECTIVE:  PERTINENT PAST HISTORY:  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 No, Number of falls: 0  MOI/History of condition:  Onset date: >3 months  SUBJECTIVE STATEMENT  Regina Vincent is a 77 y.o. female who presents to clinic with chief complaint of L knee pain with running which starts toward the end of the run which at this point is about 10 miles.  Prior to getting long COVID she was regularly running 10 mile - marathon races at a high level.  She was unable to run for an extended time since getting COVID and since she has started back as had L knee pain.  She had a round of prednisone which was very helpful and helped her increase the mileage she could run before having pain.  She has stiffness in the knee after sitting for a long period.     Red flags:  denies   Pain:  Are you having pain? No Pain location: L anterior knee around knee cap NPRS scale:  0/10 to 3/10 Aggravating factors: Running, squatting going down stairs Relieving factors: rest Pain description: intermittent and aching Stage: Chronic Stability: staying the same   Occupation: NA  Assistive Device: NA  Hand Dominance: NA  Patient Goals/Specific Activities: get up from full squat without pain, run without pain   OBJECTIVE:   DIAGNOSTIC FINDINGS:  No significant changes found on x-ray   GENERAL OBSERVATION/GAIT:   No obvious abnormality  SENSATION:  Light touch: Appears intact  PALPATION: No TTP about the knee  MUSCLE LENGTH: Hamstrings: Right no restriction; Left subtle restriction   LE MMT:  MMT Right (Eval) Left (Eval)  Hip flexion (L2, L3) 4+ 4  Knee extension (L3) 48 35  Knee flexion 26 24  Hip abduction 26 28  Hip extension 29 30.4  Hip external rotation    Hip internal rotation    Hip adduction 38 32  Ankle dorsiflexion (L4)    Ankle plantarflexion (S1)    Ankle inversion    Ankle eversion    Great Toe ext (L5)     Grossly     (Blank rows = not tested, score listed is out of 5 possible points or listed in lbs of force.  N = WNL, D = diminished, C = clear for gross weakness with myotome testing, * = concordant pain with testing)  LE ROM:  ROM Right (Eval) Left (Eval)  Hip flexion    Hip extension    Hip abduction    Hip adduction    Hip internal rotation    Hip external rotation    Knee flexion n n  Knee extension n n  Ankle dorsiflexion    Ankle plantarflexion    Ankle inversion    Ankle eversion     (Blank rows = not tested, N = WNL, * = concordant pain with testing)  Functional Tests  Eval (01/29/2023)    SL squat 55 degrees w/pain R 85 degrees                                                         PATIENT SURVEYS:  FOTO 71 -> 75   TODAY'S TREATMENT: Creating, reviewing, and completing below HEP   PATIENT EDUCATION:  POC, diagnosis, prognosis, HEP, and outcome measures.  Pt educated via explanation, demonstration, and handout (HEP).  Pt confirms understanding verbally.   HOME EXERCISE PROGRAM: Access Code: [email or web address] URL: [email or web address] Date: 01/28/2023 Prepared by: Alphonzo Severance  Exercises - Seated Knee Extension with Resistance  - 1-2 x daily - 4-7 x weekly - 1 sets - 4-5 reps - 30-45 seconds hold - Lateral Step Down  - 1 x daily - 4-7 x weekly - 3 sets - 10-15 reps - Active Straight Leg Raise with Quad Set  - 1 x daily - 4-7 x weekly - 3 sets - 10-15 reps  Treatment priorities   Eval        Progressive quad strengthening         Progressive squat/lunge                                  ASSESSMENT:  CLINICAL IMPRESSION: Regina Vincent is a 77 y.o. female who presents to clinic with signs and sxs consistent with PFPS which started after an increase in running mileage after a long and difficult recovery from COVID in which she de-trained.  Discussed at length that there were no major concerns from [facility] on the x-ray  and that  the pain was likely d/t an increase in mileage.  Discussed load management and slow introduction of agging activities including step down and squat.     OBJECTIVE IMPAIRMENTS: Pain, R knee ext strength  ACTIVITY LIMITATIONS: running, squatting, lunging without pain  PERSONAL FACTORS: See medical history and pertinent history   REHAB POTENTIAL: Good  CLINICAL DECISION MAKING: Stable/uncomplicated  EVALUATION COMPLEXITY: Low   GOALS:   SHORT TERM GOALS: Target date: 02/26/2023   Meelah will be >75% HEP compliant to improve carryover between sessions and facilitate independent management of condition  Evaluation: ongoing Goal status: INITIAL   LONG TERM GOALS: Target date: 03/26/2023    Patsie will improve FOTO score to 75 as a proxy for functional improvement  Evaluation/Baseline: 71 Goal status: INITIAL   2.  Tiyah will improve the following muscle test to >/= 45 lbs to show improvement in strength:  L knee ext (prone @ 90)   Evaluation/Baseline: see chart in note Goal status: INITIAL    3.  Vielka will self report >/= 50% decrease in pain from evaluation   Evaluation/Baseline: 3/10 max pain Goal status: INITIAL   4.  Mayson will improve knee flexion in SL squat to 75 degrees to show improvement in quad strength and/or reduction in knee pain   Evaluation/Baseline: 55 with pain Goal status: INITIAL    5.  Tryniti will report confidence in self management of condition at time of discharge with advanced HEP  Evaluation/Baseline: unable to self manage Goal status: INITIAL    6.  Hudson will be able to return to training and racing, if desired, not limited by pain  Evaluation/Baseline: limited Goal status: INITIAL   PLAN: PT FREQUENCY: 1-2x/week  PT DURATION: 8 weeks  PLANNED INTERVENTIONS: Therapeutic exercises, Aquatic therapy, Therapeutic activity, Neuro Muscular re-education, Gait training, Patient/Family education, Joint  mobilization, Dry Needling, Electrical stimulation, Spinal mobilization and/or manipulation, Moist heat, Taping, Vasopneumatic device, Ionotophoresis 4mg /ml Dexamethasone, and Manual therapy   Alphonzo Severance PT, DPT 01/29/2023, 8:29 AM

## 2023-02-01 NOTE — Progress Notes (Signed)
[facility]ina Vincent [facility] [address] [place] [address] Phone: [phone]   Assessment and Plan:     1. Chronic pain of left knee - Chronic with exacerbation, subsequent visit - Still most consistent with flare of lateral patellar spur that has resolved since completing course of prednisone and starting HEP.  Patient's only remaining complaint is increased stiffness in left knee compared to right - Patient felt that physical therapy was flaring her symptoms, so she discontinued. - Discontinue prednisone -Continue Tylenol 500 to 1000 mg tablets 2-3 times a day for day-to-day pain relief  -Continue HEP - May gradually increase running distance and length as tolerated  Pertinent previous records reviewed include left knee x-ray 01/05/2023   Follow Up: As needed if no improvement or worsening of symptoms   Subjective:   I, Regina Vincent, am serving as a Neurosurgeon for Doctor Richardean Sale   Chief Complaint: left knee pain    HPI:    01/05/2023  Patient is a 77 year old female complaining of left knee pain. Patient states that she is an avid runner, 2 years ago had COVID .Marland KitchenMarland Kitchen Thinks that's what is contributing to her left knee pain , she has increased her mileage, when she takes time off she doesn't have pain, notes stiffness after she sits, she does stretch and it feels better when she stretches her quad, she does take Asprin and that helps , she would like to increase her mileage, she is at 40 miles a week now and would like to run a half marathon , she isnt able to get a good push off with her left knee, she has pain when lunging and single leg squatting, she has been going to the stretch joint, she isnt able to get up from a squat position and that make her mad   02/02/2023 Patient states prednisone helped and she was 100 % better. Went to PT and she was flared, she stopped and started doing her own PT. She is much better and is not going  back to her PT. But she's not 100%      Relevant Historical Information: None pertinent  Additional pertinent review of systems negative.   Current Outpatient Medications:    methylPREDNISolone (MEDROL DOSEPAK) 4 MG TBPK tablet, Take 6 tablets on day 1.  Take 5 tablets on day 2.  Take 4 tablets on day 3.  Take 3 tablets on day 4.  Take 2 tablets on day 5.  Take 1 tablet on day 6., Disp: 21 tablet, Rfl: 0   Multiple Vitamins-Minerals (MULTIVITAMIN WOMENS 50+ ADV PO), Take 1 tablet by mouth daily., Disp: , Rfl:    Vitamin D, Ergocalciferol, (DRISDOL) 1.25 MG (50000 UNIT) CAPS capsule, Take 50,000 Units by mouth once a week., Disp: , Rfl:    Omega-3 Fatty Acids (OMEGA-3 FISH OIL PO), Take 60 mg by mouth in the morning and at bedtime., Disp: , Rfl:    Objective:     Vitals:   02/02/23 1438  BP: 100/80  Pulse: 61  SpO2: 96%  Weight: 109 lb (49.4 kg)  Height: 5\' 2"  (1.575 m)      Body mass index is 19.94 kg/m.    Physical Exam:    General:  awake, alert oriented, no acute distress nontoxic Skin: no suspicious lesions or rashes Neuro:sensation intact and strength 5/5 with no deficits, no atrophy, normal muscle tone Psych: No signs of anxiety, depression or other mood disorder  Left knee: No swelling No deformity Neg fluid wave, joint milking ROM Flex 110, Ext 0 NTTP over the quad tendon, medial fem condyle, lat fem condyle, patella, plica, patella tendon, tibial tuberostiy, fibular head, posterior fossa, pes anserine bursa, gerdy's tubercle, medial jt line, lateral jt line Neg anterior and posterior drawer Neg lachman Neg sag sign Negative varus stress Negative valgus stress Negative McMurray Negative Thessaly  Gait normal    Electronically signed by:  Regina Vincent [facility] 2:52 PM 02/02/23

## 2023-02-02 ENCOUNTER — Ambulatory Visit: Payer: Federal, State, Local not specified - PPO | Admitting: Sports Medicine

## 2023-02-02 VITALS — BP 100/80 | HR 61 | Ht 62.0 in | Wt 109.0 lb

## 2023-02-02 DIAGNOSIS — M25562 Pain in left knee: Secondary | ICD-10-CM

## 2023-02-02 DIAGNOSIS — G8929 Other chronic pain: Secondary | ICD-10-CM

## 2023-04-26 ENCOUNTER — Telehealth: Payer: Self-pay | Admitting: Hematology

## 2023-05-03 ENCOUNTER — Ambulatory Visit: Payer: Federal, State, Local not specified - PPO | Admitting: Hematology

## 2023-05-03 ENCOUNTER — Other Ambulatory Visit: Payer: Federal, State, Local not specified - PPO

## 2023-05-03 ENCOUNTER — Other Ambulatory Visit: Payer: Self-pay

## 2023-05-03 DIAGNOSIS — N6311 Unspecified lump in the right breast, upper outer quadrant: Secondary | ICD-10-CM

## 2023-05-03 DIAGNOSIS — Z170 Estrogen receptor positive status [ER+]: Secondary | ICD-10-CM

## 2023-05-03 NOTE — Assessment & Plan Note (Signed)
-  03/24/16 DEXA revealed osteopenia with a T-Score of -2.3 in the AP spine and -0.5 in the femoral neck. -She is on Vitamin D for her VitD deficiency. She previously declined oral calcium and biphosphonate.  -She remains very active with weightbearing exercise

## 2023-05-03 NOTE — Assessment & Plan Note (Signed)
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 S/p right lumpectomy and SLNB. The Oncotype DX score was 16, low risk. Declined adjuvant radiation and did not tolerate tamoxifen, stopped after 1 month in 2018. She has been on surveillance. -She underwent right breast lumpectomy on September 11, 2021 for recurrent invasive lobular carcinoma. --s/p RT at [facility] 3/13-4/10/23  -She declined adjuvant antiestrogen therapy -we also reviewed the indeterminate lesions on her MRI in 08/2021. She declined contrast due to concern for kidney function.

## 2023-05-04 ENCOUNTER — Inpatient Hospital Stay (HOSPITAL_BASED_OUTPATIENT_CLINIC_OR_DEPARTMENT_OTHER): Payer: Federal, State, Local not specified - PPO | Admitting: Hematology

## 2023-05-04 ENCOUNTER — Inpatient Hospital Stay: Payer: Federal, State, Local not specified - PPO | Attending: Hematology

## 2023-05-04 ENCOUNTER — Encounter: Payer: Self-pay | Admitting: Hematology

## 2023-05-04 VITALS — BP 122/72 | HR 59 | Temp 98.5°F | Resp 17 | Ht 62.0 in | Wt 111.4 lb

## 2023-05-04 DIAGNOSIS — M2550 Pain in unspecified joint: Secondary | ICD-10-CM | POA: Diagnosis not present

## 2023-05-04 DIAGNOSIS — Z886 Allergy status to analgesic agent status: Secondary | ICD-10-CM | POA: Diagnosis not present

## 2023-05-04 DIAGNOSIS — E559 Vitamin D deficiency, unspecified: Secondary | ICD-10-CM | POA: Diagnosis not present

## 2023-05-04 DIAGNOSIS — Z79899 Other long term (current) drug therapy: Secondary | ICD-10-CM | POA: Diagnosis not present

## 2023-05-04 DIAGNOSIS — M8588 Other specified disorders of bone density and structure, other site: Secondary | ICD-10-CM

## 2023-05-04 DIAGNOSIS — Z170 Estrogen receptor positive status [ER+]: Secondary | ICD-10-CM

## 2023-05-04 DIAGNOSIS — C50411 Malignant neoplasm of upper-outer quadrant of right female breast: Secondary | ICD-10-CM | POA: Insufficient documentation

## 2023-05-04 DIAGNOSIS — Z8616 Personal history of COVID-19: Secondary | ICD-10-CM | POA: Insufficient documentation

## 2023-05-04 DIAGNOSIS — M8589 Other specified disorders of bone density and structure, multiple sites: Secondary | ICD-10-CM | POA: Insufficient documentation

## 2023-05-04 DIAGNOSIS — N6311 Unspecified lump in the right breast, upper outer quadrant: Secondary | ICD-10-CM

## 2023-05-04 LAB — CMP (CANCER CENTER ONLY)
ALT: 22 U/L (ref 0–44)
AST: 25 U/L (ref 15–41)
Albumin: 4.2 g/dL (ref 3.5–5.0)
Alkaline Phosphatase: 55 U/L (ref 38–126)
Anion gap: 5 (ref 5–15)
BUN: 23 mg/dL (ref 8–23)
CO2: 30 mmol/L (ref 22–32)
Calcium: 9.5 mg/dL (ref 8.9–10.3)
Chloride: 101 mmol/L (ref 98–111)
Creatinine: 0.84 mg/dL (ref 0.44–1.00)
GFR, Estimated: >60 mL/min (ref >60)
Glucose, Bld: 108 mg/dL — ABNORMAL HIGH (ref 70–99)
Potassium: 4.4 mmol/L (ref 3.5–5.1)
Sodium: 136 mmol/L (ref 135–145)
Total Bilirubin: 0.5 mg/dL (ref 0.3–1.2)
Total Protein: 6.8 g/dL (ref 6.5–8.1)

## 2023-05-04 LAB — CBC WITH DIFFERENTIAL (CANCER CENTER ONLY)
Abs Immature Granulocytes: 0.02 10*3/uL (ref 0.00–0.07)
Basophils Absolute: 0.1 10*3/uL (ref 0.0–0.1)
Basophils Relative: 1 %
Eosinophils Absolute: 0.1 10*3/uL (ref 0.0–0.5)
Eosinophils Relative: 1 %
HCT: 41.1 % (ref 36.0–46.0)
Hemoglobin: 14.5 g/dL (ref 12.0–15.0)
Immature Granulocytes: 0 %
Lymphocytes Relative: 25 %
Lymphs Abs: 1.6 10*3/uL (ref 0.7–4.0)
MCH: 33.6 pg (ref 26.0–34.0)
MCHC: 35.3 g/dL (ref 30.0–36.0)
MCV: 95.4 fL (ref 80.0–100.0)
Monocytes Absolute: 0.5 10*3/uL (ref 0.1–1.0)
Monocytes Relative: 7 %
Neutro Abs: 4.3 10*3/uL (ref 1.7–7.7)
Neutrophils Relative %: 66 %
Platelet Count: 222 10*3/uL (ref 150–400)
RBC: 4.31 MIL/uL (ref 3.87–5.11)
RDW: 11.9 % (ref 11.5–15.5)
WBC Count: 6.6 10*3/uL (ref 4.0–10.5)
nRBC: 0 % (ref 0.0–0.2)

## 2023-05-04 NOTE — Progress Notes (Signed)
[facility]   [phone] Fax:([phone]   Clinic Follow up Note   Patient Care Team: Dois Davenport, MD as PCP - General (Family Medicine) Orbie Pyo, MD as PCP - Cardiology (Cardiology) Seward Meth Dennison Nancy, MD as Referring Physician (Hematology and Oncology) Darryl Lent, MD as Referring Physician (Surgical Oncology) Wynetta Emery, MD as Referring Physician (Radiation Oncology) Loa Socks, NP as Nurse Practitioner (Hematology and Oncology) Malachy Mood, MD as Consulting Physician (Hematology and Oncology)  Date of Service:  05/04/2023  CHIEF COMPLAINT: f/u of  recurrent right breast cancer   CURRENT THERAPY:  Surveillance  ASSESSMENT:  Regina Vincent is a 77 y.o. 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 S/p right lumpectomy and SLNB. The Oncotype DX score was 16, low risk. Declined adjuvant radiation and did not tolerate tamoxifen, stopped after 1 month in 2018. She has been on surveillance. -She underwent right breast lumpectomy on September 11, 2021 for recurrent invasive lobular carcinoma. --s/p RT at [facility] 3/13-4/10/23  -She declined adjuvant antiestrogen therapy -we also reviewed the indeterminate lesions on her MRI in 08/2021. She declined contrast due to concern for kidney function.   Osteopenia -03/24/16 DEXA revealed osteopenia with a T-Score of -2.3 in the AP spine and -0.5 in the femoral neck. -She is on Vitamin D for her VitD deficiency. She previously declined oral calcium and biphosphonate.  -She remains very active with weightbearing exercise     PLAN: -lab reviewed -CMP-pending -lab and f/u in 1 year   SUMMARY OF ONCOLOGIC HISTORY: Oncology History Overview Note   Cancer Staging  Breast cancer of upper-outer quadrant of right female  breast [facility]) Staging form: Breast, AJCC 7th Edition - Pathologic stage from 02/13/2017: Stage IA (T1c(m), N0, cM0) - Signed by Malachy Mood, MD on 10/27/2016 Laterality: Right Multiple tumors: Yes Histologic grade (G): G2 Residual tumor (R): R0 - None Estrogen receptor status: Positive Progesterone receptor status: Positive HER2 status: Negative     Breast cancer of upper-outer quadrant of right female breast (HCC)  12/25/2015 Mammogram   Bilateral diagnostic mammogram and [place] 12/25/15 1) Irregular mass with partially obscured spiculated margins measuring 1.7 cm in the UOQ right breast. 2) USshowed a round hypoechoic mass with indistinct margins in the right breast the 10:00 position 3 cm the nipple which measured 1.1 x 0.9 x 1.1 cm. Additionally, there was a second adjacent mass in the right breast at the 10:00 position 3 cm the nipple which measured 0.5 x 0.6 cm. US of the right axilla was negative.   01/03/2016 Initial Biopsy   Right breast biopsy 01/03/16 Biopsy of the right smaller mass revealed LCIS with no invasive carcinoma identified. Biopsy of the larger right breast mass revealed grade 2 invasive lobular carcinoma.   01/03/2016 Receptors her2   ER 71-80% +, PR 91-100% +, HER2 -   01/23/2016 Breast MRI   Bilateral breast MRI 01/23/16 Adjacent enhancing masses in the right breast at 10:00, consistent with  biopsy-proven ILC and LCIS and consistent with prior mammograms and ultrasound.  The area including the two masses measured approximately 1.6 x 1.3 x 0.9 cm (although the extent of disease on ultrasound measured 3.0 cm).  No evidence of additional sites of disease.   01/24/2016 Initial Diagnosis   Breast cancer of upper-outer quadrant of right female breast (  HCC)   02/14/2016 Surgery   Right lumpectomy and SLN biopsy 02/14/16 Lumpectomy revealed grade 2 invasive lobular carcinoma with two foci, measuring 1.4 cm and 0.6 cm in a background of lobular carcinoma in situ, negative margins. A  biopsied right axillary lymph node was negative. mpT1c, pN0   02/14/2016 Oncotype testing   Oncotype DX score was 16; indicting a recurrence risk of 10% in 5 years with Tamoxifen.   09/11/2016 - 10/08/2016 Anti-estrogen oral therapy   Dr. Merlene Pulling prescribed Tamoxifen 20 mg once a day on 09/11/16. The patient stopped on 10/08/16 due to mood swings (depression), night sweats, difficulty sleeping, broken nails, headaches, right arm pain, light-headedness, chills, fever, fatigue, abdominal bloating, and abdominal pain. The patient reports her symptoms have since resolved, except for fatigue.    06/25/2021 Mammogram   EXAM:  MAMMO DIAGNOSTIC  BREAST WITH TOMOSYNTHESIS RIGHT MAMMO US BREAST LIMITED RIGHT  Impression  7 mm irregular shaped mass with angular margins in the right breast 12:00,  suspicious for malignancy given patient's history    06/25/2021 Relapse/Recurrence   DIAGNOSIS     A. Right breast, 12:00, heart clip, ultrasound-guided biopsy:   Invasive lobular carcinoma   - Nottingham grade 3 (tubules: 3, nuclei: 3, mitoses: 2)  - Invasive carcinoma measures 6 mm   Lobular carcinom in situ (LCIS). Lymphovascular invasion is not identified.      % of Cells Staining Intensity Score Interpretation  ER IHC 92 3+ POSITIVE  PR IHC 97 3+ POSITIVE  HER2/neu IHC 40 2+ EQUIVOCAL    Addendum     INTERPRETATION   HER2/Neu Gene Copy Number Centromere 17 ([id number]) Gene Copy Number HER2/Neu to CEP17 Ratio Interpretation  HER2/Neu FISH  1.7 1.6 1.26 NOT AMPLIFIED        07/31/2021 Imaging   EXAM:  CT Chest, Abdomen, and Pelvis with contrast   Impression:  1. No CT evidence of acute intrathoracic or abdominopelvic pathology.  2. No suspicious nodularity or lymphadenopathy within the chest wall or axilla.  3. Multiple hypodense lesions within the liver measuring greater than simple fluid: Dominant lesions within the left hepatic lobe silhouette characteristics consistent with hemangioma,  although hypodense lesions within segment 8 and segment 5 are indeterminate. Further evaluation with contrast-enhanced liver protocol MRI may be of benefit for further characterization    07/31/2021 Imaging   EXAM:  Bone Scan   IMPRESSION: No scintigraphic evidence of osseous metastasis.    09/11/2021 Definitive Surgery   DIAGNOSIS    A. Right breast, seed localized lumpectomy: Invasive lobular carcinoma   - Nottingham grade 3 (tubules: 3, nuclei: 3, mitoses: 2)  - Invasive carcinoma measures 6 mm   Ductal carcinoma in situ (DCIS) is not identified. Lobular carcinoma in situ (LCIS), classical type. Lymphovascular invasion is not identified.   Closest margins (invasive):  - <1 mm to posterior  - 4 mm to inferior   B. Right breast, superior margin, excision: Benign breast tissue.   C. Right breast, lateral margin, excision: Benign breast tissue.   D. Right breast, inferior margin, excision: Lobular carcinoma in situ (LCIS). There is no evidence of malignancy.   E. Right breast, medial margin, excision: Benign breast tissue.     10/27/2021 - 11/24/2021 Radiation Therapy   DOSE: 2850 cGy out of planned 2850 cGy PLANNED BOOST: None planned FRACTION: 5 out of planned 5      INTERVAL HISTORY:  Si Forbes is here for a follow up of recurrent right breast cancer. She was  last seen by me on 02/05/2022. She presents to the clinic alone. Pt state that she not running as much due to she was having joint pain. Pt state that she went PT had a week of steroids, and it has helped her joint pain. Pt clinically is doing well. She denies having any pain.      All other systems were reviewed with the patient and are negative.  MEDICAL HISTORY:  Past Medical History:  Diagnosis Date   Abnormal levels of other serum enzymes    Actinic keratosis    Acute cough    Acute ethmoidal sinusitis, unspecified    Breast cancer (HCC)    COVID-19    Dehydration    Disorder of the  autonomic nervous system, unspecified    Dizziness and giddiness    Headache    Hypovolemia    Other fatigue    Other specified disorders of bone density and structure, unspecified site    Otitis media    Post covid-19 condition, unspecified    Radiculopathy of cervical region    Strain of muscle, fascia and tendon at neck level, initial encounter    Syncope and collapse    Traumatic rupture of collateral ligament of right ring finger at metacarpophalangeal and interphalangeal joint, initial encounter    Unspecified eustachian tube disorder, bilateral    Vitamin D deficiency     SURGICAL HISTORY: Past Surgical History:  Procedure Laterality Date   BREAST LUMPECTOMY     BREAST LUMPECTOMY WITH AXILLARY LYMPH NODE BIOPSY Right    cataract surgery   2015    I have reviewed the social history and family history with the patient and they are unchanged from previous note.  ALLERGIES:  is allergic to naproxen, shellfish-derived products, and ibuprofen.  MEDICATIONS:  Current Outpatient Medications  Medication Sig Dispense Refill   methylPREDNISolone (MEDROL DOSEPAK) 4 MG TBPK tablet Take 6 tablets on day 1.  Take 5 tablets on day 2.  Take 4 tablets on day 3.  Take 3 tablets on day 4.  Take 2 tablets on day 5.  Take 1 tablet on day 6. 21 tablet 0   Multiple Vitamins-Minerals (MULTIVITAMIN WOMENS 50+ ADV PO) Take 1 tablet by mouth daily.     Omega-3 Fatty Acids (OMEGA-3 FISH OIL PO) Take 60 mg by mouth in the morning and at bedtime.     Vitamin D, Ergocalciferol, (DRISDOL) 1.25 MG (50000 UNIT) CAPS capsule Take 50,000 Units by mouth once a week.     No current facility-administered medications for this visit.    PHYSICAL EXAMINATION: ECOG PERFORMANCE STATUS: 0 - Asymptomatic  Vitals:   05/04/23 1344  BP: 122/72  Pulse: (!) 59  Resp: 17  Temp: 98.5 F (36.9 C)  SpO2: 100%   Wt Readings from Last 3 Encounters:  05/04/23 111 lb 6.4 oz (50.5 kg)  02/02/23 109 lb (49.4 kg)   01/05/23 107 lb (48.5 kg)     GENERAL:alert, no distress and comfortable SKIN: skin color normal, no rashes or significant lesions EYES: normal, Conjunctiva are pink and non-injected, sclera clear  NEURO: alert & oriented x 3 with fluent speech NECK: (-)supple, thyroid normal size, non-tender, without nodularity LYMPH: (-) no palpable lymphadenopathy in the cervical, axillary  ABDOMEN:(-) abdomen soft, (-) non-tender and (-) normal bowel sounds Breast: rt breast no palpable mass, breast exam benign. Lt breast no palpable mass, breast exam benign.  LABORATORY DATA:  I have reviewed the data as listed  Latest Ref Rng & Units 05/04/2023    1:19 PM 02/05/2022    1:34 PM 08/27/2021    1:51 PM  CBC  WBC 4.0 - 10.5 K/uL 6.6  6.5  7.7   Hemoglobin 12.0 - 15.0 g/dL 10.2  72.5  36.6   Hematocrit 36.0 - 46.0 % 41.1  41.7  42.0   Platelets 150 - 400 K/uL 222  213  193         Latest Ref Rng & Units 05/04/2023    1:19 PM 02/05/2022    1:34 PM 08/27/2021    1:51 PM  CMP  Glucose 70 - 99 mg/dL 440  81  347   BUN 8 - 23 mg/dL 23  17  20    Creatinine 0.44 - 1.00 mg/dL 4.25  9.56  3.87   Sodium 135 - 145 mmol/L 136  136  134   Potassium 3.5 - 5.1 mmol/L 4.4  4.3  4.5   Chloride 98 - 111 mmol/L 101  100  97   CO2 22 - 32 mmol/L 30  30  31    Calcium 8.9 - 10.3 mg/dL 9.5  9.7  9.8   Total Protein 6.5 - 8.1 g/dL 6.8  7.1  7.1   Total Bilirubin 0.3 - 1.2 mg/dL 0.5  0.5  0.6   Alkaline Phos 38 - 126 U/L 55  54  52   AST 15 - 41 U/L 25  22  30    ALT 0 - 44 U/L 22  14  23        RADIOGRAPHIC STUDIES: I have personally reviewed the radiological images as listed and agreed with the findings in the report. No results found.    No orders of the defined types were placed in this encounter.  All questions were answered. The patient knows to call the clinic with any problems, questions or concerns. No barriers to learning was detected. The total time spent in the appointment was 20 minutes.      Malachy Mood, MD 05/04/2023   Carolin Coy, CMA, am acting as scribe for Malachy Mood, MD.   I have reviewed the above documentation for accuracy and completeness, and I agree with the above.

## 2023-05-06 ENCOUNTER — Telehealth: Payer: Self-pay | Admitting: Hematology

## 2023-06-22 ENCOUNTER — Other Ambulatory Visit: Payer: Self-pay | Admitting: Medical Genetics

## 2023-06-22 DIAGNOSIS — Z006 Encounter for examination for normal comparison and control in clinical research program: Secondary | ICD-10-CM

## 2023-07-12 ENCOUNTER — Other Ambulatory Visit (HOSPITAL_COMMUNITY): Payer: Self-pay

## 2023-08-23 ENCOUNTER — Other Ambulatory Visit (HOSPITAL_COMMUNITY): Payer: Self-pay

## 2023-09-21 ENCOUNTER — Encounter: Payer: Self-pay | Admitting: Internal Medicine

## 2023-09-21 NOTE — Telephone Encounter (Signed)
 Error

## 2023-09-27 ENCOUNTER — Other Ambulatory Visit (HOSPITAL_COMMUNITY)
Admission: RE | Admit: 2023-09-27 | Discharge: 2023-09-27 | Disposition: A | Discharge status: Home / Self Care | Payer: Self-pay | Source: Ambulatory Visit | Attending: Medical Genetics | Admitting: Medical Genetics

## 2023-09-27 DIAGNOSIS — Z006 Encounter for examination for normal comparison and control in clinical research program: Secondary | ICD-10-CM | POA: Insufficient documentation

## 2023-09-30 ENCOUNTER — Other Ambulatory Visit: Payer: Self-pay | Admitting: Medical Genetics

## 2023-09-30 DIAGNOSIS — Z006 Encounter for examination for normal comparison and control in clinical research program: Secondary | ICD-10-CM

## 2023-09-30 NOTE — Progress Notes (Signed)
New order needed. Confirmed consent on file

## 2023-10-13 ENCOUNTER — Telehealth: Payer: Self-pay | Admitting: Nurse Practitioner

## 2023-10-13 NOTE — Telephone Encounter (Signed)
 Patient is aware of scheduled appointment times/dates

## 2023-10-15 LAB — GENECONNECT MOLECULAR SCREEN: Genetic Analysis Overall Interpretation: NEGATIVE

## 2024-05-25 ENCOUNTER — Telehealth: Payer: Self-pay | Admitting: Nurse Practitioner

## 2024-05-25 ENCOUNTER — Encounter: Payer: Self-pay | Admitting: Hematology and Oncology

## 2024-05-25 NOTE — Telephone Encounter (Signed)
 Regina Vincent called in to re-schedule her appointment. She has been re scheduled and she is aware of her appointment on 10/27.

## 2024-05-30 ENCOUNTER — Ambulatory Visit: Payer: Federal, State, Local not specified - PPO | Admitting: Nurse Practitioner

## 2024-05-30 ENCOUNTER — Other Ambulatory Visit: Payer: Federal, State, Local not specified - PPO

## 2024-06-01 ENCOUNTER — Inpatient Hospital Stay: Payer: Federal, State, Local not specified - PPO | Admitting: Nurse Practitioner

## 2024-06-01 ENCOUNTER — Inpatient Hospital Stay: Payer: Federal, State, Local not specified - PPO

## 2024-06-11 ENCOUNTER — Other Ambulatory Visit: Payer: Self-pay | Admitting: Nurse Practitioner

## 2024-06-11 DIAGNOSIS — C50411 Malignant neoplasm of upper-outer quadrant of right female breast: Secondary | ICD-10-CM

## 2024-06-11 NOTE — Assessment & Plan Note (Deleted)
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 S/p right lumpectomy and SLNB. The Oncotype DX score was 16, low risk. Declined adjuvant radiation and did not tolerate tamoxifen, stopped after 1 month in 2018. She has been on surveillance. -She underwent right breast lumpectomy on September 11, 2021 for recurrent invasive lobular carcinoma. --s/p RT at [facility] 3/13-4/10/23  -She declined adjuvant antiestrogen therapy -we also reviewed the indeterminate lesions on her MRI in 08/2021. She declined contrast due to concern for kidney function.

## 2024-06-11 NOTE — Progress Notes (Deleted)
 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 S/p right lumpectomy and SLNB. The Oncotype DX score was 16, low risk. Declined adjuvant radiation and did not tolerate tamoxifen , stopped after 1 month in 2018. She has been on surveillance. -She underwent right breast lumpectomy on September 11, 2021 for recurrent invasive lobular carcinoma. --s/p RT at [facility] 3/13-4/10/23  -She declined adjuvant antiestrogen therapy -we also reviewed the indeterminate lesions on her MRI in 08/2021. She declined contrast due to concern for kidney function.     The patient understands the plans discussed today and is in agreement with them.  She knows to contact our office if she develops concerns prior to her next appointment.  I provided *** minutes of face-to-face time during this encounter and > 50% was spent counseling as documented under my assessment and plan.    Powell FORBES Lessen, NP  [facility] - A DEPT OF [facility] [address] [place] [address] Dept: [phone] Dept Fax: [phone]   No orders of the defined types were placed in this encounter.     CHIEF COMPLAINT:  CC: Right breast cancer, ER  +  Current Treatment: Surveillance  INTERVAL HISTORY:  Regina Vincent is here today for repeat clinical assessment.  She last saw Dr. Lanny on 05/04/2023.  She had not diagnostic mammogram done 12/21/2023 yielding benign results.  She denies fevers or chills. She denies pain. Her appetite is good. Her weight {Weight change:10426}.  I have reviewed the past medical history, past surgical history, social history and family history with the patient and they are unchanged from previous note.  ALLERGIES:  is allergic to naproxen, shellfish protein-containing drug products, and ibuprofen.  MEDICATIONS:  Current Outpatient Medications  Medication Sig Dispense Refill   methylPREDNISolone  (MEDROL  DOSEPAK) 4 MG TBPK tablet Take 6 tablets on day 1.  Take 5 tablets on day 2.  Take 4 tablets on day 3.  Take 3 tablets on day 4.  Take 2 tablets on day 5.  Take 1 tablet on day 6. 21 tablet 0   Multiple Vitamins-Minerals (MULTIVITAMIN WOMENS 50+ ADV PO) Take 1 tablet by mouth daily.     Omega-3 Fatty Acids (OMEGA-3 FISH OIL PO) Take 60 mg by mouth in the morning and at bedtime.     Vitamin D, Ergocalciferol, (DRISDOL) 1.25 MG (50000 UNIT) CAPS capsule Take 50,000 Units by mouth once a week.     No current facility-administered medications for this visit.    HISTORY OF PRESENT ILLNESS:   Oncology History Overview Note   Cancer Staging  Breast cancer of upper-outer quadrant of right female breast [facility]) Staging form: Breast, AJCC 7th Edition - Pathologic stage from 02/13/2017: Stage IA (T1c(m), N0, cM0) - Signed by Lanny Callander, MD on 10/27/2016 Laterality: Right Multiple tumors:  Yes Histologic grade (G): G2 Residual tumor (R): R0 - None Estrogen receptor status: Positive Progesterone receptor status: Positive HER2 status: Negative     Breast cancer of upper-outer quadrant of right female breast (HCC)  12/25/2015 Mammogram   Bilateral diagnostic mammogram and US  12/25/15 1) Irregular mass with partially  obscured spiculated margins measuring 1.7 cm in the UOQ right breast. 2) USshowed a round hypoechoic mass with indistinct margins in the right breast the 10:00 position 3 cm the nipple which measured 1.1 x 0.9 x 1.1 cm. Additionally, there was a second adjacent mass in the right breast at the 10:00 position 3 cm the nipple which measured 0.5 x 0.6 cm. US  of the right axilla was negative.   01/03/2016 Initial Biopsy   Right breast biopsy 01/03/16 Biopsy of the right smaller mass revealed LCIS with no invasive carcinoma identified. Biopsy of the larger right breast mass revealed grade 2 invasive lobular carcinoma.   01/03/2016 Receptors her2   ER 71-80% +, PR 91-100% +, HER2 -   01/23/2016 Breast MRI   Bilateral breast MRI 01/23/16 Adjacent enhancing masses in the right breast at 10:00, consistent with  biopsy-proven ILC and LCIS and consistent with prior mammograms and ultrasound.  The area including the two masses measured approximately 1.6 x 1.3 x 0.9 cm (although the extent of disease on ultrasound measured 3.0 cm).  No evidence of additional sites of disease.   01/24/2016 Initial Diagnosis   Breast cancer of upper-outer quadrant of right female breast (HCC)   02/14/2016 Surgery   Right lumpectomy and SLN biopsy 02/14/16 Lumpectomy revealed grade 2 invasive lobular carcinoma with two foci, measuring 1.4 cm and 0.6 cm in a background of lobular carcinoma in situ, negative margins. A biopsied right axillary lymph node was negative. mpT1c, pN0   02/14/2016 Oncotype testing   Oncotype DX score was 16; indicting a recurrence risk of 10% in 5 years with Tamoxifen .   09/11/2016 - 10/08/2016 Anti-estrogen oral therapy   Dr. Rudell prescribed Tamoxifen  20 mg once a day on 09/11/16. The patient stopped on 10/08/16 due to mood swings (depression), night sweats, difficulty sleeping, broken nails, headaches, right arm pain, light-headedness, chills, fever, fatigue, abdominal bloating, and abdominal pain. The patient  reports her symptoms have since resolved, except for fatigue.    06/25/2021 Mammogram   EXAM:  MAMMO DIAGNOSTIC  BREAST WITH TOMOSYNTHESIS RIGHT MAMMO US  BREAST LIMITED RIGHT  Impression  7 mm irregular shaped mass with angular margins in the right breast 12:00,  suspicious for malignancy given patient's history    06/25/2021 Relapse/Recurrence   DIAGNOSIS     A. Right breast, 12:00, heart clip, ultrasound-guided biopsy:   Invasive lobular carcinoma   - Nottingham grade 3 (tubules: 3, nuclei: 3, mitoses: 2)  - Invasive carcinoma measures 6 mm   Lobular carcinom in situ (LCIS). Lymphovascular invasion is not identified.      % of Cells Staining Intensity Score Interpretation  ER IHC 92 3+ POSITIVE  PR IHC 97 3+ POSITIVE  HER2/neu IHC 40 2+ EQUIVOCAL    Addendum     INTERPRETATION   HER2/Neu Gene Copy Number Centromere 17 (CEP17) Gene Copy Number HER2/Neu to CEP17 Ratio Interpretation  HER2/Neu FISH  1.7 1.6 1.26 NOT AMPLIFIED        07/31/2021 Imaging   EXAM:  CT Chest, Abdomen, and Pelvis with contrast   Impression:  1. No CT evidence of acute intrathoracic or abdominopelvic pathology.  2. No suspicious nodularity or lymphadenopathy within  the chest wall or axilla.  3. Multiple hypodense lesions within the liver measuring greater than simple fluid: Dominant lesions within the left hepatic lobe silhouette characteristics consistent with hemangioma, although hypodense lesions within segment 8 and segment 5 are indeterminate. Further evaluation with contrast-enhanced liver protocol MRI may be of benefit for further characterization    07/31/2021 Imaging   EXAM:  Bone Scan   IMPRESSION: No scintigraphic evidence of osseous metastasis.    09/11/2021 Definitive Surgery   DIAGNOSIS    A. Right breast, seed localized lumpectomy: Invasive lobular carcinoma   - Nottingham grade 3 (tubules: 3, nuclei: 3, mitoses: 2)  - Invasive carcinoma measures 6 mm   Ductal carcinoma in  situ (DCIS) is not identified. Lobular carcinoma in situ (LCIS), classical type. Lymphovascular invasion is not identified.   Closest margins (invasive):  - <1 mm to posterior  - 4 mm to inferior   B. Right breast, superior margin, excision: Benign breast tissue.   C. Right breast, lateral margin, excision: Benign breast tissue.   D. Right breast, inferior margin, excision: Lobular carcinoma in situ (LCIS). There is no evidence of malignancy.   E. Right breast, medial margin, excision: Benign breast tissue.     10/27/2021 - 11/24/2021 Radiation Therapy   DOSE: 2850 cGy out of planned 2850 cGy PLANNED BOOST: None planned FRACTION: 5 out of planned 5       REVIEW OF SYSTEMS:   Constitutional: Denies fevers, chills or abnormal weight loss Eyes: Denies blurriness of vision Ears, nose, mouth, throat, and face: Denies mucositis or sore throat Respiratory: Denies cough, dyspnea or wheezes Cardiovascular: Denies palpitation, chest discomfort or lower extremity swelling Gastrointestinal:  Denies nausea, heartburn or change in bowel habits Skin: Denies abnormal skin rashes Lymphatics: Denies new lymphadenopathy or easy bruising Neurological:Denies numbness, tingling or new weaknesses Behavioral/Psych: Mood is stable, no new changes  All other systems were reviewed with the patient and are negative.   VITALS:  There were no vitals taken for this visit.  Wt Readings from Last 3 Encounters:  05/04/23 111 lb 6.4 oz (50.5 kg)  02/02/23 109 lb (49.4 kg)  01/05/23 107 lb (48.5 kg)    There is no height or weight on file to calculate BMI.  Performance status (ECOG): {CHL ONC [id number]  PHYSICAL EXAM:   GENERAL:alert, no distress and comfortable SKIN: skin color, texture, turgor are normal, no rashes or significant lesions EYES: normal, Conjunctiva are pink and non-injected, sclera clear OROPHARYNX:no exudate, no erythema and lips, buccal mucosa, and tongue normal  NECK:  supple, thyroid normal size, non-tender, without nodularity LYMPH:  no palpable lymphadenopathy in the cervical, axillary or inguinal LUNGS: clear to auscultation and percussion with normal breathing effort HEART: regular rate & rhythm and no murmurs and no lower extremity edema ABDOMEN:abdomen soft, non-tender and normal bowel sounds Musculoskeletal:no cyanosis of digits and no clubbing  NEURO: alert & oriented x 3 with fluent speech, no focal motor/sensory deficits  LABORATORY DATA:  I have reviewed the data as listed    Component Value Date/Time   NA 136 05/04/2023 1319   NA 138 05/24/2017 1342   K 4.4 05/04/2023 1319   K 4.6 05/24/2017 1342   CL 101 05/04/2023 1319   CO2 30 05/04/2023 1319   CO2 29 05/24/2017 1342   GLUCOSE 108 (H) 05/04/2023 1319   GLUCOSE 103 05/24/2017 1342   BUN 23 05/04/2023 1319   BUN 19.9 05/24/2017 1342   CREATININE 0.84 05/04/2023 1319   CREATININE  0.9 05/24/2017 1342   CALCIUM 9.5 05/04/2023 1319   CALCIUM 10.1 05/24/2017 1342   PROT 6.8 05/04/2023 1319   PROT 7.4 05/24/2017 1342   ALBUMIN 4.2 05/04/2023 1319   ALBUMIN 4.3 05/24/2017 1342   AST 25 05/04/2023 1319   AST 54 (H) 05/24/2017 1342   ALT 22 05/04/2023 1319   ALT 39 05/24/2017 1342   ALKPHOS 55 05/04/2023 1319   ALKPHOS 62 05/24/2017 1342   BILITOT 0.5 05/04/2023 1319   BILITOT 0.79 05/24/2017 1342   GFRNONAA >60 05/04/2023 1319   GFRAA >60 06/22/2019 1002    No results found for: SPEP, UPEP  Lab Results  Component Value Date   WBC 6.6 05/04/2023   NEUTROABS 4.3 05/04/2023   HGB 14.5 05/04/2023   HCT 41.1 05/04/2023   MCV 95.4 05/04/2023   PLT 222 05/04/2023      Chemistry      Component Value Date/Time   NA 136 05/04/2023 1319   NA 138 05/24/2017 1342   K 4.4 05/04/2023 1319   K 4.6 05/24/2017 1342   CL 101 05/04/2023 1319   CO2 30 05/04/2023 1319   CO2 29 05/24/2017 1342   BUN 23 05/04/2023 1319   BUN 19.9 05/24/2017 1342   CREATININE 0.84 05/04/2023 1319    CREATININE 0.9 05/24/2017 1342      Component Value Date/Time   CALCIUM 9.5 05/04/2023 1319   CALCIUM 10.1 05/24/2017 1342   ALKPHOS 55 05/04/2023 1319   ALKPHOS 62 05/24/2017 1342   AST 25 05/04/2023 1319   AST 54 (H) 05/24/2017 1342   ALT 22 05/04/2023 1319   ALT 39 05/24/2017 1342   BILITOT 0.5 05/04/2023 1319   BILITOT 0.79 05/24/2017 1342       RADIOGRAPHIC STUDIES: I have personally reviewed the radiological images as listed and agreed with the findings in the report. No results found.

## 2024-06-12 ENCOUNTER — Inpatient Hospital Stay: Attending: Hematology

## 2024-06-12 ENCOUNTER — Inpatient Hospital Stay: Admitting: Nurse Practitioner

## 2024-06-12 DIAGNOSIS — C50411 Malignant neoplasm of upper-outer quadrant of right female breast: Secondary | ICD-10-CM

## 2024-08-01 ENCOUNTER — Encounter: Payer: Self-pay | Admitting: Hematology and Oncology

## 2024-08-03 ENCOUNTER — Other Ambulatory Visit: Payer: Self-pay | Admitting: Nurse Practitioner

## 2024-08-03 DIAGNOSIS — Z170 Estrogen receptor positive status [ER+]: Secondary | ICD-10-CM

## 2024-08-03 NOTE — Progress Notes (Deleted)
 Patient Care Team: Burney Darice CROME, MD as PCP - General (Family Medicine) Wendel Lurena POUR, MD as PCP - Cardiology (Cardiology) Irma Deward Sor, MD as Referring Physician (Hematology and Oncology) Betha Ernie Florence, MD as Referring Physician (Surgical Oncology) Tony Chandler, MD as Referring Physician (Radiation Oncology) Crawford Morna Pickle, NP as Nurse Practitioner (Hematology and Oncology) Regina Vincent Callander, MD as Consulting Physician (Hematology and Oncology)  Clinic Day:  08/03/2024  Referring physician: Burney Darice CROME, MD  ASSESSMENT & PLAN:   Assessment & Plan: No problem-specific Assessment & Plan notes found for this encounter.    The patient understands the plans discussed today and is in agreement with them.  She knows to contact our office if she develops concerns prior to her next appointment.  I provided *** minutes of face-to-face time during this encounter and > 50% was spent counseling as documented under my assessment and plan.    Regina FORBES Lessen, NP  [place] [facility] MED ONC - A DEPT OF [facility] [address] [place] [address] Dept: [phone] Dept Fax: [phone]   No orders of the defined types were placed in this encounter.     CHIEF COMPLAINT:  CC: Right breast cancer, ER +  Current Treatment: Surveillance  INTERVAL HISTORY:  Regina Vincent is here today for repeat clinical assessment.  She last saw Dr. Lanny on 05/04/2023.  She had diagnostic mammogram on 10/29/2023 at [facility].  Results were benign.  She denies fevers or chills. She denies pain. Her appetite is good. Her weight {Weight change:10426}.  I have reviewed the past medical history, past surgical history, social history and family history with the patient and they are unchanged from previous note.  ALLERGIES:  is allergic to naproxen, shellfish protein-containing drug products, and ibuprofen.  MEDICATIONS:  Current  Outpatient Medications  Medication Sig Dispense Refill   methylPREDNISolone  (MEDROL  DOSEPAK) 4 MG TBPK tablet Take 6 tablets on day 1.  Take 5 tablets on day 2.  Take 4 tablets on day 3.  Take 3 tablets on day 4.  Take 2 tablets on day 5.  Take 1 tablet on day 6. 21 tablet 0   Multiple Vitamins-Minerals (MULTIVITAMIN WOMENS 50+ ADV PO) Take 1 tablet by mouth daily.     Omega-3 Fatty Acids (OMEGA-3 FISH OIL PO) Take 60 mg by mouth in the morning and at bedtime.     Vitamin D, Ergocalciferol, (DRISDOL) 1.25 MG (50000 UNIT) CAPS capsule Take 50,000 Units by mouth once a week.     No current facility-administered medications for this visit.    HISTORY OF PRESENT ILLNESS:   Oncology History Overview Note   Cancer Staging  Breast cancer of upper-outer quadrant of right female breast [facility]) Staging form: Breast, AJCC 7th Edition - Pathologic stage from 02/13/2017: Stage IA (T1c(m), N0, cM0) - Signed by Regina Vincent Callander, MD on 10/27/2016 Laterality: Right Multiple tumors: Yes Histologic grade (G): G2 Residual tumor (R): R0 - None Estrogen receptor status: Positive Progesterone receptor status: Positive HER2 status: Negative     Breast cancer of upper-outer quadrant of right female breast (HCC)  12/25/2015 Mammogram   Bilateral diagnostic mammogram and US  12/25/15 1) Irregular mass with partially obscured spiculated margins measuring 1.7 cm in the UOQ right breast. 2) USshowed a round hypoechoic mass with indistinct margins in the right breast the 10:00 position 3 cm the nipple which measured 1.1 x 0.9 x 1.1 cm. Additionally, there was a second  adjacent mass in the right breast at the 10:00 position 3 cm the nipple which measured 0.5 x 0.6 cm. US  of the right axilla was negative.   01/03/2016 Initial Biopsy   Right breast biopsy 01/03/16 Biopsy of the right smaller mass revealed LCIS with no invasive carcinoma identified. Biopsy of the larger right breast mass revealed grade 2 invasive lobular  carcinoma.   01/03/2016 Receptors her2   ER 71-80% +, PR 91-100% +, HER2 -   01/23/2016 Breast MRI   Bilateral breast MRI 01/23/16 Adjacent enhancing masses in the right breast at 10:00, consistent with  biopsy-proven ILC and LCIS and consistent with prior mammograms and ultrasound.  The area including the two masses measured approximately 1.6 x 1.3 x 0.9 cm (although the extent of disease on ultrasound measured 3.0 cm).  No evidence of additional sites of disease.   01/24/2016 Initial Diagnosis   Breast cancer of upper-outer quadrant of right female breast (HCC)   02/14/2016 Surgery   Right lumpectomy and SLN biopsy 02/14/16 Lumpectomy revealed grade 2 invasive lobular carcinoma with two foci, measuring 1.4 cm and 0.6 cm in a background of lobular carcinoma in situ, negative margins. A biopsied right axillary lymph node was negative. mpT1c, pN0   02/14/2016 Oncotype testing   Oncotype DX score was 16; indicting a recurrence risk of 10% in 5 years with Tamoxifen .   09/11/2016 - 10/08/2016 Anti-estrogen oral therapy   Dr. Rudell prescribed Tamoxifen  20 mg once a day on 09/11/16. The patient stopped on 10/08/16 due to mood swings (depression), night sweats, difficulty sleeping, broken nails, headaches, right arm pain, light-headedness, chills, fever, fatigue, abdominal bloating, and abdominal pain. The patient reports her symptoms have since resolved, except for fatigue.    06/25/2021 Mammogram   EXAM:  MAMMO DIAGNOSTIC  BREAST WITH TOMOSYNTHESIS RIGHT MAMMO US  BREAST LIMITED RIGHT  Impression  7 mm irregular shaped mass with angular margins in the right breast 12:00,  suspicious for malignancy given patient's history    06/25/2021 Relapse/Recurrence   DIAGNOSIS     A. Right breast, 12:00, heart clip, ultrasound-guided biopsy:   Invasive lobular carcinoma   - Nottingham grade 3 (tubules: 3, nuclei: 3, mitoses: 2)  - Invasive carcinoma measures 6 mm   Lobular carcinom in situ  (LCIS). Lymphovascular invasion is not identified.      % of Cells Staining Intensity Score Interpretation  ER IHC 92 3+ POSITIVE  PR IHC 97 3+ POSITIVE  HER2/neu IHC 40 2+ EQUIVOCAL    Addendum     INTERPRETATION   HER2/Neu Gene Copy Number Centromere 17 (CEP17) Gene Copy Number HER2/Neu to CEP17 Ratio Interpretation  HER2/Neu FISH  1.7 1.6 1.26 NOT AMPLIFIED        07/31/2021 Imaging   EXAM:  CT Chest, Abdomen, and Pelvis with contrast   Impression:  1. No CT evidence of acute intrathoracic or abdominopelvic pathology.  2. No suspicious nodularity or lymphadenopathy within the chest wall or axilla.  3. Multiple hypodense lesions within the liver measuring greater than simple fluid: Dominant lesions within the left hepatic lobe silhouette characteristics consistent with hemangioma, although hypodense lesions within segment 8 and segment 5 are indeterminate. Further evaluation with contrast-enhanced liver protocol MRI may be of benefit for further characterization    07/31/2021 Imaging   EXAM:  Bone Scan   IMPRESSION: No scintigraphic evidence of osseous metastasis.    09/11/2021 Definitive Surgery   DIAGNOSIS    A. Right breast, seed localized lumpectomy: Invasive lobular carcinoma   -  Nottingham grade 3 (tubules: 3, nuclei: 3, mitoses: 2)  - Invasive carcinoma measures 6 mm   Ductal carcinoma in situ (DCIS) is not identified. Lobular carcinoma in situ (LCIS), classical type. Lymphovascular invasion is not identified.   Closest margins (invasive):  - <1 mm to posterior  - 4 mm to inferior   B. Right breast, superior margin, excision: Benign breast tissue.   C. Right breast, lateral margin, excision: Benign breast tissue.   D. Right breast, inferior margin, excision: Lobular carcinoma in situ (LCIS). There is no evidence of malignancy.   E. Right breast, medial margin, excision: Benign breast tissue.     10/27/2021 - 11/24/2021 Radiation Therapy   DOSE: 2850 cGy  out of planned 2850 cGy PLANNED BOOST: None planned FRACTION: 5 out of planned 5       REVIEW OF SYSTEMS:   Constitutional: Denies fevers, chills or abnormal weight loss Eyes: Denies blurriness of vision Ears, nose, mouth, throat, and face: Denies mucositis or sore throat Respiratory: Denies cough, dyspnea or wheezes Cardiovascular: Denies palpitation, chest discomfort or lower extremity swelling Gastrointestinal:  Denies nausea, heartburn or change in bowel habits Skin: Denies abnormal skin rashes Lymphatics: Denies new lymphadenopathy or easy bruising Neurological:Denies numbness, tingling or new weaknesses Behavioral/Psych: Mood is stable, no new changes  All other systems were reviewed with the patient and are negative.   VITALS:  There were no vitals taken for this visit.  Wt Readings from Last 3 Encounters:  05/04/23 111 lb 6.4 oz (50.5 kg)  02/02/23 109 lb (49.4 kg)  01/05/23 107 lb (48.5 kg)    There is no height or weight on file to calculate BMI.  Performance status (ECOG): {CHL ONC [id number]  PHYSICAL EXAM:   GENERAL:alert, no distress and comfortable SKIN: skin color, texture, turgor are normal, no rashes or significant lesions EYES: normal, Conjunctiva are pink and non-injected, sclera clear OROPHARYNX:no exudate, no erythema and lips, buccal mucosa, and tongue normal  NECK: supple, thyroid normal size, non-tender, without nodularity LYMPH:  no palpable lymphadenopathy in the cervical, axillary or inguinal LUNGS: clear to auscultation and percussion with normal breathing effort HEART: regular rate & rhythm and no murmurs and no lower extremity edema ABDOMEN:abdomen soft, non-tender and normal bowel sounds Musculoskeletal:no cyanosis of digits and no clubbing  NEURO: alert & oriented x 3 with fluent speech, no focal motor/sensory deficits  LABORATORY DATA:  I have reviewed the data as listed    Component Value Date/Time   NA 136 05/04/2023 1319    NA 138 05/24/2017 1342   K 4.4 05/04/2023 1319   K 4.6 05/24/2017 1342   CL 101 05/04/2023 1319   CO2 30 05/04/2023 1319   CO2 29 05/24/2017 1342   GLUCOSE 108 (H) 05/04/2023 1319   GLUCOSE 103 05/24/2017 1342   BUN 23 05/04/2023 1319   BUN 19.9 05/24/2017 1342   CREATININE 0.84 05/04/2023 1319   CREATININE 0.9 05/24/2017 1342   CALCIUM 9.5 05/04/2023 1319   CALCIUM 10.1 05/24/2017 1342   PROT 6.8 05/04/2023 1319   PROT 7.4 05/24/2017 1342   ALBUMIN 4.2 05/04/2023 1319   ALBUMIN 4.3 05/24/2017 1342   AST 25 05/04/2023 1319   AST 54 (H) 05/24/2017 1342   ALT 22 05/04/2023 1319   ALT 39 05/24/2017 1342   ALKPHOS 55 05/04/2023 1319   ALKPHOS 62 05/24/2017 1342   BILITOT 0.5 05/04/2023 1319   BILITOT 0.79 05/24/2017 1342   GFRNONAA >60 05/04/2023 1319   GFRAA >60 06/22/2019  1002    No results found for: SPEP, UPEP  Lab Results  Component Value Date   WBC 6.6 05/04/2023   NEUTROABS 4.3 05/04/2023   HGB 14.5 05/04/2023   HCT 41.1 05/04/2023   MCV 95.4 05/04/2023   PLT 222 05/04/2023      Chemistry      Component Value Date/Time   NA 136 05/04/2023 1319   NA 138 05/24/2017 1342   K 4.4 05/04/2023 1319   K 4.6 05/24/2017 1342   CL 101 05/04/2023 1319   CO2 30 05/04/2023 1319   CO2 29 05/24/2017 1342   BUN 23 05/04/2023 1319   BUN 19.9 05/24/2017 1342   CREATININE 0.84 05/04/2023 1319   CREATININE 0.9 05/24/2017 1342      Component Value Date/Time   CALCIUM 9.5 05/04/2023 1319   CALCIUM 10.1 05/24/2017 1342   ALKPHOS 55 05/04/2023 1319   ALKPHOS 62 05/24/2017 1342   AST 25 05/04/2023 1319   AST 54 (H) 05/24/2017 1342   ALT 22 05/04/2023 1319   ALT 39 05/24/2017 1342   BILITOT 0.5 05/04/2023 1319   BILITOT 0.79 05/24/2017 1342       RADIOGRAPHIC STUDIES: I have personally reviewed the radiological images as listed and agreed with the findings in the report. No results found.

## 2024-08-03 NOTE — Assessment & Plan Note (Deleted)
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 S/p right lumpectomy and SLNB. The Oncotype DX score was 16, low risk. Declined adjuvant radiation and did not tolerate tamoxifen, stopped after 1 month in 2018. She has been on surveillance. -She underwent right breast lumpectomy on September 11, 2021 for recurrent invasive lobular carcinoma. --s/p RT at [facility] 3/13-4/10/23  -She declined adjuvant antiestrogen therapy -we also reviewed the indeterminate lesions on her MRI in 08/2021. She declined contrast due to concern for kidney function.

## 2024-08-04 ENCOUNTER — Inpatient Hospital Stay: Admitting: Nurse Practitioner

## 2024-08-04 ENCOUNTER — Telehealth: Payer: Self-pay

## 2024-08-04 ENCOUNTER — Inpatient Hospital Stay: Attending: Hematology

## 2024-08-04 NOTE — Telephone Encounter (Signed)
 LVM requesting pt to give Powell Ball, NP office a call d/t pt's appts will need to be rescheduled or reassigned to another provider.  Awaiting return call.

## 2024-08-18 ENCOUNTER — Ambulatory Visit: Admitting: Obstetrics and Gynecology

## 2024-09-05 ENCOUNTER — Ambulatory Visit: Admitting: Obstetrics and Gynecology

## 2024-09-07 ENCOUNTER — Other Ambulatory Visit: Payer: Self-pay

## 2024-09-07 DIAGNOSIS — C50411 Malignant neoplasm of upper-outer quadrant of right female breast: Secondary | ICD-10-CM

## 2024-09-07 NOTE — Progress Notes (Signed)
 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 S/p right lumpectomy and SLNB. The Oncotype DX score was 16, low risk. Declined adjuvant radiation and did not tolerate tamoxifen , stopped after 1 month in 2018. She has been on surveillance. -She underwent right breast lumpectomy on September 11, 2021 for recurrent invasive lobular carcinoma. --s/p RT at [facility] 3/13-4/10/23  -She declined adjuvant antiestrogen therapy -we also reviewed the indeterminate lesions on her MRI in 08/2021. She declined contrast due to concern for kidney function.  - 3D diagnostic mammogram done 10/29/2023 with benign results.  She is scheduled for repeat 3D diagnostic mammogram in 10/2024. -Plan for labs and follow-up in 1 year, sooner if needed.    Bone health Most recent DEXA scan done at [facility] on 38 2027.  Results were consistent with osteopenia/low bone mass.  She does take high-dose vitamin D weekly.  She is very physically active.  She is a long-distance runner and will generally run 5 to 7 miles daily, if not further.  She has declined further DEXA scans.  Will continue with vitamin D and physical activity as tolerated.  Right breast cancer, ER + Patient is on breast cancer surveillance.  Her  most recent 3D diagnostic mammogram was done at [facility] on 10/29/2023.  Results were benign.  She is scheduled for a repeat diagnostic mammogram in March 2026.  She has not noticed any changes, lumps, or masses in either breast.  She is clinically doing well with unremarkable breast exam today.  Plan Labs reviewed. - Unremarkable CBC and CMP. Reviewed mammogram from March 2025 which was benign. - Scheduled for diagnostic mammogram in March 2026.  She has been at [facility] Continue with breast cancer surveillance. Labs and follow-up in 1 year, sooner if needed.  The patient understands the plans discussed today and is in agreement with them.  She knows to contact our office if she develops concerns prior to her next appointment.  I provided 25 minutes of face-to-face time during this encounter and > 50% was spent counseling as documented under my assessment and plan.    Powell FORBES Lessen, NP  [facility] - A DEPT OF [facility] [address] [place] [address] Dept: [phone] Dept Fax: [phone]   No orders of the defined types were placed in this encounter.     CHIEF COMPLAINT:  CC: Right breast cancer, ER +  Current Treatment: Surveillance  INTERVAL HISTORY:  Nicci is here today for repeat clinical assessment.  She was last seen by Dr. Lanny on 05/04/2023.  She is behind on routine follow-ups due to the passing of her long-term partner in December.  She is now getting back on schedule.  She is on breast cancer surveillance as she was intolerant to antiestrogen therapy.  Most  recent mammogram done at [facility] on 10/29/2023 with benign results.  She is already scheduled for a repeat diagnostic mammogram in March 2026.  She has not noticed any changes, new lumps, or masses in either breast.  She declines excess skin.  She is very physically active and takes high-dose vitamin D weekly.  She denies chest pain, chest  pressure, or shortness of breath. She denies headaches or visual disturbances. She denies abdominal pain, nausea, vomiting, or changes in bowel or bladder habits.   She denies fevers or chills. She denies pain. Her appetite is good. Her weight has been stable.  I have reviewed the past medical history, past surgical history, social history and family history with the patient and they are unchanged from previous note.  ALLERGIES:  is allergic to naproxen, shellfish protein-containing drug products, and ibuprofen.  MEDICATIONS:  Current Outpatient Medications  Medication Sig Dispense Refill   Multiple Vitamins-Minerals (MULTIVITAMIN WOMENS 50+ ADV PO) Take 1 tablet by mouth daily.     methylPREDNISolone  (MEDROL  DOSEPAK) 4 MG TBPK tablet Take 6 tablets on day 1.  Take 5 tablets on day 2.  Take 4 tablets on day 3.  Take 3 tablets on day 4.  Take 2 tablets on day 5.  Take 1 tablet on day 6. 21 tablet 0   Omega-3 Fatty Acids (OMEGA-3 FISH OIL PO) Take 60 mg by mouth in the morning and at bedtime.     Vitamin D, Ergocalciferol, (DRISDOL) 1.25 MG (50000 UNIT) CAPS capsule Take 50,000 Units by mouth once a week.     No current facility-administered medications for this visit.    HISTORY OF PRESENT ILLNESS:   Oncology History Overview Note   Cancer Staging  Breast cancer of upper-outer quadrant of right female breast [facility]) Staging form: Breast, AJCC 7th Edition - Pathologic stage from 02/13/2017: Stage IA (T1c(m), N0, cM0) - Signed by Lanny Callander, MD on 10/27/2016 Laterality: Right Multiple tumors: Yes Histologic grade (G): G2 Residual tumor (R): R0 - None Estrogen receptor status: Positive Progesterone receptor status: Positive HER2 status: Negative     Breast cancer of upper-outer quadrant of right female breast (HCC)  12/25/2015 Mammogram   Bilateral diagnostic mammogram and US  12/25/15 1) Irregular mass with partially obscured spiculated margins measuring 1.7 cm in the UOQ right breast. 2)  USshowed a round hypoechoic mass with indistinct margins in the right breast the 10:00 position 3 cm the nipple which measured 1.1 x 0.9 x 1.1 cm. Additionally, there was a second adjacent mass in the right breast at the 10:00 position 3 cm the nipple which measured 0.5 x 0.6 cm. US  of the right axilla was negative.   01/03/2016 Initial Biopsy   Right breast biopsy 01/03/16 Biopsy of the right smaller mass revealed LCIS with no invasive carcinoma identified. Biopsy of the larger right breast mass revealed grade 2 invasive lobular carcinoma.   01/03/2016 Receptors her2   ER 71-80% +, PR 91-100% +, HER2 -   01/23/2016 Breast MRI   Bilateral breast MRI 01/23/16 Adjacent enhancing masses in the right breast at 10:00, consistent with  biopsy-proven ILC and LCIS and consistent with prior mammograms and ultrasound.  The area including the two masses measured approximately 1.6 x 1.3 x 0.9 cm (although the extent of disease on ultrasound measured 3.0 cm).  No evidence of additional sites of disease.   01/24/2016 Initial Diagnosis   Breast cancer of upper-outer quadrant of right female breast (HCC)   02/14/2016 Surgery   Right  lumpectomy and SLN biopsy 02/14/16 Lumpectomy revealed grade 2 invasive lobular carcinoma with two foci, measuring 1.4 cm and 0.6 cm in a background of lobular carcinoma in situ, negative margins. A biopsied right axillary lymph node was negative. mpT1c, pN0   02/14/2016 Oncotype testing   Oncotype DX score was 16; indicting a recurrence risk of 10% in 5 years with Tamoxifen .   09/11/2016 - 10/08/2016 Anti-estrogen oral therapy   Dr. Rudell prescribed Tamoxifen  20 mg once a day on 09/11/16. The patient stopped on 10/08/16 due to mood swings (depression), night sweats, difficulty sleeping, broken nails, headaches, right arm pain, light-headedness, chills, fever, fatigue, abdominal bloating, and abdominal pain. The patient reports her symptoms have since resolved, except for fatigue.     06/25/2021 Mammogram   EXAM:  MAMMO DIAGNOSTIC  BREAST WITH TOMOSYNTHESIS RIGHT MAMMO US  BREAST LIMITED RIGHT  Impression  7 mm irregular shaped mass with angular margins in the right breast 12:00,  suspicious for malignancy given patient's history    06/25/2021 Relapse/Recurrence   DIAGNOSIS     A. Right breast, 12:00, heart clip, ultrasound-guided biopsy:   Invasive lobular carcinoma   - Nottingham grade 3 (tubules: 3, nuclei: 3, mitoses: 2)  - Invasive carcinoma measures 6 mm   Lobular carcinom in situ (LCIS). Lymphovascular invasion is not identified.      % of Cells Staining Intensity Score Interpretation  ER IHC 92 3+ POSITIVE  PR IHC 97 3+ POSITIVE  HER2/neu IHC 40 2+ EQUIVOCAL    Addendum     INTERPRETATION   HER2/Neu Gene Copy Number Centromere 17 ([id number]) Gene Copy Number HER2/Neu to CEP17 Ratio Interpretation  HER2/Neu FISH  1.7 1.6 1.26 NOT AMPLIFIED        07/31/2021 Imaging   EXAM:  CT Chest, Abdomen, and Pelvis with contrast   Impression:  1. No CT evidence of acute intrathoracic or abdominopelvic pathology.  2. No suspicious nodularity or lymphadenopathy within the chest wall or axilla.  3. Multiple hypodense lesions within the liver measuring greater than simple fluid: Dominant lesions within the left hepatic lobe silhouette characteristics consistent with hemangioma, although hypodense lesions within segment 8 and segment 5 are indeterminate. Further evaluation with contrast-enhanced liver protocol MRI may be of benefit for further characterization    07/31/2021 Imaging   EXAM:  Bone Scan   IMPRESSION: No scintigraphic evidence of osseous metastasis.    09/11/2021 Definitive Surgery   DIAGNOSIS    A. Right breast, seed localized lumpectomy: Invasive lobular carcinoma   - Nottingham grade 3 (tubules: 3, nuclei: 3, mitoses: 2)  - Invasive carcinoma measures 6 mm   Ductal carcinoma in situ (DCIS) is not identified. Lobular carcinoma in situ (LCIS),  classical type. Lymphovascular invasion is not identified.   Closest margins (invasive):  - <1 mm to posterior  - 4 mm to inferior   B. Right breast, superior margin, excision: Benign breast tissue.   C. Right breast, lateral margin, excision: Benign breast tissue.   D. Right breast, inferior margin, excision: Lobular carcinoma in situ (LCIS). There is no evidence of malignancy.   E. Right breast, medial margin, excision: Benign breast tissue.     10/27/2021 - 11/24/2021 Radiation Therapy   DOSE: 2850 cGy out of planned 2850 cGy PLANNED BOOST: None planned FRACTION: 5 out of planned 5       REVIEW OF SYSTEMS:   Constitutional: Denies fevers, chills or abnormal weight loss Eyes: Denies blurriness of vision Ears, nose, mouth, throat, and face: Denies  mucositis or sore throat Respiratory: Denies cough, dyspnea or wheezes Cardiovascular: Denies palpitation, chest discomfort or lower extremity swelling Gastrointestinal:  Denies nausea, heartburn or change in bowel habits Skin: Denies abnormal skin rashes Lymphatics: Denies new lymphadenopathy or easy bruising Neurological:Denies numbness, tingling or new weaknesses Behavioral/Psych: Mood is stable, no new changes  All other systems were reviewed with the patient and are negative.   VITALS:   Today's Vitals   09/08/24 1325  BP: 132/68  Pulse: 69  Resp: 17  Temp: (!) 96.5 F (35.8 C)  SpO2: 100%  Weight: 110 lb (49.9 kg)  PainSc: 0-No pain   Body mass index is 20.12 kg/m.    Wt Readings from Last 3 Encounters:  09/08/24 110 lb (49.9 kg)  05/04/23 111 lb 6.4 oz (50.5 kg)  02/02/23 109 lb (49.4 kg)    Body mass index is 20.12 kg/m.  Performance status (ECOG): 0 - Asymptomatic  PHYSICAL EXAM:   GENERAL:alert, no distress and comfortable SKIN: skin color, texture, turgor are normal, no rashes or significant lesions EYES: normal, Conjunctiva are pink and non-injected, sclera clear OROPHARYNX:no exudate, no  erythema and lips, buccal mucosa, and tongue normal  NECK: supple, thyroid normal size, non-tender, without nodularity LYMPH:  no palpable lymphadenopathy in the cervical, axillary or inguinal LUNGS: clear to auscultation and percussion with normal breathing effort HEART: regular rate & rhythm and no murmurs and no lower extremity edema ABDOMEN:abdomen soft, non-tender and normal bowel sounds Musculoskeletal:no cyanosis of digits and no clubbing  NEURO: alert & oriented x 3 with fluent speech, no focal motor/sensory deficits BREAST: There is a well-healed lumpectomy scar right breast.  There are no palpable lumps or masses noted today.  There is no nipple inversion or nipple discharge.  There is mild right axillary tenderness along the surgical scar.  No axillary lymphadenopathy is noted on the right.  There are no palpable lumps or masses in the left breast.  There is no nipple inversion or nipple discharge.  There is no axillary lymphadenopathy on the left.  LABORATORY DATA:  I have reviewed the data as listed    Component Value Date/Time   NA 138 09/08/2024 1300   NA 138 05/24/2017 1342   K 4.4 09/08/2024 1300   K 4.6 05/24/2017 1342   CL 100 09/08/2024 1300   CO2 27 09/08/2024 1300   CO2 29 05/24/2017 1342   GLUCOSE 108 (H) 09/08/2024 1300   GLUCOSE 103 05/24/2017 1342   BUN 16 09/08/2024 1300   BUN 19.9 05/24/2017 1342   CREATININE 0.90 09/08/2024 1300   CREATININE 0.9 05/24/2017 1342   CALCIUM 9.5 09/08/2024 1300   CALCIUM 10.1 05/24/2017 1342   PROT 7.1 09/08/2024 1300   PROT 7.4 05/24/2017 1342   ALBUMIN 4.4 09/08/2024 1300   ALBUMIN 4.3 05/24/2017 1342   AST 29 09/08/2024 1300   AST 54 (H) 05/24/2017 1342   ALT 18 09/08/2024 1300   ALT 39 05/24/2017 1342   ALKPHOS 62 09/08/2024 1300   ALKPHOS 62 05/24/2017 1342   BILITOT 0.4 09/08/2024 1300   BILITOT 0.79 05/24/2017 1342   GFRNONAA >60 09/08/2024 1300   GFRAA >60 06/22/2019 1002    Lab Results  Component Value  Date   WBC 6.8 09/08/2024   NEUTROABS 4.5 09/08/2024   HGB 14.2 09/08/2024   HCT 39.6 09/08/2024   MCV 91.7 09/08/2024   PLT 230 09/08/2024    "

## 2024-09-07 NOTE — Assessment & Plan Note (Addendum)
 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 S/p right lumpectomy and SLNB. The Oncotype DX score was 16, low risk. Declined adjuvant radiation and did not tolerate tamoxifen , stopped after 1 month in 2018. She has been on surveillance. -She underwent right breast lumpectomy on September 11, 2021 for recurrent invasive lobular carcinoma. --s/p RT at [facility] 3/13-4/10/23  -She declined adjuvant antiestrogen therapy -we also reviewed the indeterminate lesions on her MRI in 08/2021. She declined contrast due to concern for kidney function.  - 3D diagnostic mammogram done 10/29/2023 with benign results.  She is scheduled for repeat 3D diagnostic mammogram in 10/2024. -Plan for labs and follow-up in 1 year, sooner if needed.

## 2024-09-08 ENCOUNTER — Inpatient Hospital Stay: Attending: Hematology

## 2024-09-08 ENCOUNTER — Inpatient Hospital Stay: Admitting: Nurse Practitioner

## 2024-09-08 VITALS — BP 132/68 | HR 69 | Temp 96.5°F | Resp 17 | Wt 110.0 lb

## 2024-09-08 DIAGNOSIS — Z170 Estrogen receptor positive status [ER+]: Secondary | ICD-10-CM | POA: Diagnosis not present

## 2024-09-08 DIAGNOSIS — C50411 Malignant neoplasm of upper-outer quadrant of right female breast: Secondary | ICD-10-CM

## 2024-09-08 LAB — CMP (CANCER CENTER ONLY)
ALT: 18 U/L (ref 0–44)
AST: 29 U/L (ref 15–41)
Albumin: 4.4 g/dL (ref 3.5–5.0)
Alkaline Phosphatase: 62 U/L (ref 38–126)
Anion gap: 11 (ref 5–15)
BUN: 16 mg/dL (ref 8–23)
CO2: 27 mmol/L (ref 22–32)
Calcium: 9.5 mg/dL (ref 8.9–10.3)
Chloride: 100 mmol/L (ref 98–111)
Creatinine: 0.9 mg/dL (ref 0.44–1.00)
GFR, Estimated: >60 mL/min
Glucose, Bld: 108 mg/dL — ABNORMAL HIGH (ref 70–99)
Potassium: 4.4 mmol/L (ref 3.5–5.1)
Sodium: 138 mmol/L (ref 135–145)
Total Bilirubin: 0.4 mg/dL (ref 0.0–1.2)
Total Protein: 7.1 g/dL (ref 6.5–8.1)

## 2024-09-08 LAB — CBC WITH DIFFERENTIAL (CANCER CENTER ONLY)
Abs Immature Granulocytes: 0.01 K/uL (ref 0.00–0.07)
Basophils Absolute: 0.1 K/uL (ref 0.0–0.1)
Basophils Relative: 1 %
Eosinophils Absolute: 0.1 K/uL (ref 0.0–0.5)
Eosinophils Relative: 1 %
HCT: 39.6 % (ref 36.0–46.0)
Hemoglobin: 14.2 g/dL (ref 12.0–15.0)
Immature Granulocytes: 0 %
Lymphocytes Relative: 26 %
Lymphs Abs: 1.8 K/uL (ref 0.7–4.0)
MCH: 32.9 pg (ref 26.0–34.0)
MCHC: 35.9 g/dL (ref 30.0–36.0)
MCV: 91.7 fL (ref 80.0–100.0)
Monocytes Absolute: 0.4 K/uL (ref 0.1–1.0)
Monocytes Relative: 6 %
Neutro Abs: 4.5 K/uL (ref 1.7–7.7)
Neutrophils Relative %: 66 %
Platelet Count: 230 K/uL (ref 150–400)
RBC: 4.32 MIL/uL (ref 3.87–5.11)
RDW: 13.1 % (ref 11.5–15.5)
WBC Count: 6.8 K/uL (ref 4.0–10.5)
nRBC: 0 % (ref 0.0–0.2)

## 2024-09-11 ENCOUNTER — Encounter: Payer: Self-pay | Admitting: Nurse Practitioner

## 2024-09-11 ENCOUNTER — Encounter: Payer: Self-pay | Admitting: Hematology and Oncology

## 2024-10-04 ENCOUNTER — Ambulatory Visit: Admitting: Obstetrics and Gynecology

## 2025-06-14 ENCOUNTER — Inpatient Hospital Stay: Admitting: Nurse Practitioner

## 2025-06-14 ENCOUNTER — Inpatient Hospital Stay: Attending: Hematology
